# Patient Record
Sex: Female | Born: 1960 | Race: White | Hispanic: No | Marital: Married | Smoking: Never smoker
Health system: Southern US, Community
[De-identification: ages and names within clinical notes are randomized; demographics above are authoritative.]

## PROBLEM LIST (undated history)

## (undated) DIAGNOSIS — G43909 Migraine, unspecified, not intractable, without status migrainosus: Secondary | ICD-10-CM

## (undated) DIAGNOSIS — D509 Iron deficiency anemia, unspecified: Secondary | ICD-10-CM

## (undated) HISTORY — PX: OTHER SURGICAL HISTORY: SHX169

## (undated) HISTORY — DX: Migraine, unspecified, not intractable, without status migrainosus: G43.909

## (undated) HISTORY — DX: Iron deficiency anemia, unspecified: D50.9

---

## 1999-06-03 ENCOUNTER — Other Ambulatory Visit: Admission: RE | Admit: 1999-06-03 | Discharge: 1999-06-03 | Payer: Self-pay | Admitting: Obstetrics & Gynecology

## 1999-07-08 ENCOUNTER — Encounter: Payer: Self-pay | Admitting: Obstetrics & Gynecology

## 1999-07-08 ENCOUNTER — Encounter: Admission: RE | Admit: 1999-07-08 | Discharge: 1999-07-08 | Payer: Self-pay | Admitting: Obstetrics & Gynecology

## 2000-07-08 ENCOUNTER — Encounter: Payer: Self-pay | Admitting: Obstetrics & Gynecology

## 2000-07-08 ENCOUNTER — Encounter: Admission: RE | Admit: 2000-07-08 | Discharge: 2000-07-08 | Payer: Self-pay | Admitting: Obstetrics & Gynecology

## 2000-07-11 ENCOUNTER — Other Ambulatory Visit: Admission: RE | Admit: 2000-07-11 | Discharge: 2000-07-11 | Payer: Self-pay | Admitting: Obstetrics & Gynecology

## 2001-07-11 ENCOUNTER — Other Ambulatory Visit: Admission: RE | Admit: 2001-07-11 | Discharge: 2001-07-11 | Payer: Self-pay | Admitting: Obstetrics & Gynecology

## 2001-08-15 ENCOUNTER — Encounter (INDEPENDENT_AMBULATORY_CARE_PROVIDER_SITE_OTHER): Payer: Self-pay

## 2001-08-15 ENCOUNTER — Ambulatory Visit (HOSPITAL_COMMUNITY): Admission: RE | Admit: 2001-08-15 | Discharge: 2001-08-15 | Payer: Self-pay | Admitting: Obstetrics & Gynecology

## 2002-07-25 ENCOUNTER — Other Ambulatory Visit: Admission: RE | Admit: 2002-07-25 | Discharge: 2002-07-25 | Payer: Self-pay | Admitting: Obstetrics & Gynecology

## 2003-07-31 ENCOUNTER — Other Ambulatory Visit: Admission: RE | Admit: 2003-07-31 | Discharge: 2003-07-31 | Payer: Self-pay | Admitting: Obstetrics & Gynecology

## 2003-08-01 ENCOUNTER — Encounter: Admission: RE | Admit: 2003-08-01 | Discharge: 2003-08-01 | Payer: Self-pay | Admitting: Obstetrics & Gynecology

## 2004-04-26 ENCOUNTER — Encounter: Admission: RE | Admit: 2004-04-26 | Discharge: 2004-04-26 | Payer: Self-pay | Admitting: Orthopedic Surgery

## 2004-08-23 ENCOUNTER — Encounter: Admission: RE | Admit: 2004-08-23 | Discharge: 2004-08-23 | Payer: Self-pay | Admitting: Orthopedic Surgery

## 2005-08-04 ENCOUNTER — Encounter: Admission: RE | Admit: 2005-08-04 | Discharge: 2005-08-04 | Payer: Self-pay | Admitting: Obstetrics & Gynecology

## 2008-01-24 ENCOUNTER — Encounter: Admission: RE | Admit: 2008-01-24 | Discharge: 2008-01-24 | Payer: Self-pay | Admitting: Obstetrics & Gynecology

## 2008-01-25 ENCOUNTER — Encounter: Admission: RE | Admit: 2008-01-25 | Discharge: 2008-01-25 | Payer: Self-pay | Admitting: Family Medicine

## 2009-08-18 ENCOUNTER — Ambulatory Visit (HOSPITAL_COMMUNITY): Admission: RE | Admit: 2009-08-18 | Discharge: 2009-08-18 | Payer: Self-pay | Admitting: Obstetrics & Gynecology

## 2009-11-20 ENCOUNTER — Encounter (INDEPENDENT_AMBULATORY_CARE_PROVIDER_SITE_OTHER): Payer: Self-pay | Admitting: Family Medicine

## 2009-11-20 ENCOUNTER — Ambulatory Visit: Payer: Self-pay | Admitting: Vascular Surgery

## 2009-11-20 ENCOUNTER — Ambulatory Visit: Admission: RE | Admit: 2009-11-20 | Discharge: 2009-11-20 | Payer: Self-pay | Admitting: Family Medicine

## 2010-03-10 ENCOUNTER — Encounter: Admission: RE | Admit: 2010-03-10 | Discharge: 2010-03-10 | Payer: Self-pay | Admitting: Obstetrics & Gynecology

## 2010-06-14 ENCOUNTER — Encounter: Payer: Self-pay | Admitting: Obstetrics & Gynecology

## 2010-08-10 ENCOUNTER — Other Ambulatory Visit: Payer: Self-pay | Admitting: Gastroenterology

## 2010-08-16 LAB — CBC
Hemoglobin: 13.3 g/dL (ref 12.0–15.0)
MCHC: 34.1 g/dL (ref 30.0–36.0)
Platelets: 249 10*3/uL (ref 150–400)
RDW: 13.3 % (ref 11.5–15.5)

## 2010-08-16 LAB — PREGNANCY, URINE: Preg Test, Ur: NEGATIVE

## 2010-10-09 NOTE — Op Note (Signed)
[facility]  Patient:    Kara Donaldson, Kara Donaldson Visit Number: [id number] MRN: [id number]          Service Type: DSU Location: [employer] Attending Physician:  Genia Del Dictated by:   Genia Del, M.D. Proc. Date: 08/15/01 Admit Date:  08/15/2001                             Operative Report  DATE OF BIRTH:                1960-09-23  PREOPERATIVE DIAGNOSES:       Menometrorrhagia with probable endometrial polyps.  POSTOPERATIVE DIAGNOSES:      Menometrorrhagia with probable endometrial polyps, confirmed endometrial polyps.  INTERVENTION:                 Hysteroscopy with polypectomies and dilatation and curettage.  SURGEON:                      Genia Del, M.D.  ANESTHESIOLOGIST:             Gretta Cool., M.D.  INTERVENTION:                 Under general anesthesia with laryngeal mask the patient is in lithotomy position.  She is prepped with Betadine on the suprapubic, vulvar, and vaginal areas.  The bladder is emptied with a catheter and the patient is draped as usual.  The vaginal examination reveals a retroverted uterus, mobile, normal volume, no adnexal mass.  The speculum is inserted.  A paracervical block is made with 1% Nesacaine 20 cc at 4 and 8 oclock.  We then grasp the anterior lip of the cervix with a tenaculum and dilatation is achieved with Hegar dilators up to #33.  We then insert the resectoscope.  The largest polyp was just at the opening of the internal os and was removed by inserting the resectoscope.  That specimen is sent to pathology.  We then see a second polyp that is removed the same way and then reenter the resectoscope and complete visualization of the endometrial cavity. A few additional smaller polyps are present and are excised with the loop. All specimens all sent together to pathology.  We then remove the resectoscope and proceed with a systematic curettage of the endometrial cavity with  the sharp curette.  Endometrial curettings are sent separately.  We then reintroduce the hysteroscope.  The intrauterine cavity is regular.  No additional lesion is present.  Hemostasis is adequate.  Pictures are taken. The resectoscope is removed.  We then remove the tenaculum.  Hemostasis is completed at that level with the silver nitrate stick.  We then remove the speculum.  Ins and outs were about -100 cc.  The estimated blood loss was minimal.  No complication occurred and the patient was brought to recovery room in good status. Dictated by:   Genia Del, M.D. Attending Physician:  Genia Del DD:  08/15/01 TD:  08/16/01 Job: [id number]

## 2011-07-27 ENCOUNTER — Other Ambulatory Visit: Payer: Self-pay | Admitting: Obstetrics & Gynecology

## 2011-07-27 DIAGNOSIS — Z1231 Encounter for screening mammogram for malignant neoplasm of breast: Secondary | ICD-10-CM

## 2011-08-09 ENCOUNTER — Ambulatory Visit
Admission: RE | Admit: 2011-08-09 | Discharge: 2011-08-09 | Disposition: A | Discharge status: Home / Self Care | Payer: 59 | Source: Ambulatory Visit | Attending: Obstetrics & Gynecology | Admitting: Obstetrics & Gynecology

## 2011-08-09 DIAGNOSIS — Z1231 Encounter for screening mammogram for malignant neoplasm of breast: Secondary | ICD-10-CM

## 2011-08-11 ENCOUNTER — Other Ambulatory Visit: Payer: Self-pay | Admitting: Obstetrics & Gynecology

## 2011-08-11 DIAGNOSIS — R928 Other abnormal and inconclusive findings on diagnostic imaging of breast: Secondary | ICD-10-CM

## 2011-08-17 ENCOUNTER — Ambulatory Visit
Admission: RE | Admit: 2011-08-17 | Discharge: 2011-08-17 | Disposition: A | Discharge status: Home / Self Care | Payer: 59 | Source: Ambulatory Visit | Attending: Obstetrics & Gynecology | Admitting: Obstetrics & Gynecology

## 2011-08-17 DIAGNOSIS — R928 Other abnormal and inconclusive findings on diagnostic imaging of breast: Secondary | ICD-10-CM

## 2012-06-14 ENCOUNTER — Other Ambulatory Visit: Payer: Self-pay | Admitting: Family Medicine

## 2012-06-14 ENCOUNTER — Ambulatory Visit
Admission: RE | Admit: 2012-06-14 | Discharge: 2012-06-14 | Disposition: A | Discharge status: Home / Self Care | Payer: 59 | Source: Ambulatory Visit | Attending: Family Medicine | Admitting: Family Medicine

## 2012-06-14 DIAGNOSIS — M549 Dorsalgia, unspecified: Secondary | ICD-10-CM

## 2012-09-27 ENCOUNTER — Other Ambulatory Visit: Payer: Self-pay

## 2012-09-27 DIAGNOSIS — Z1231 Encounter for screening mammogram for malignant neoplasm of breast: Secondary | ICD-10-CM

## 2012-10-25 ENCOUNTER — Ambulatory Visit
Admission: RE | Admit: 2012-10-25 | Discharge: 2012-10-25 | Disposition: A | Discharge status: Home / Self Care | Payer: 59 | Source: Ambulatory Visit

## 2012-10-25 DIAGNOSIS — Z1231 Encounter for screening mammogram for malignant neoplasm of breast: Secondary | ICD-10-CM

## 2013-11-21 ENCOUNTER — Other Ambulatory Visit: Payer: Self-pay

## 2013-11-21 DIAGNOSIS — Z1231 Encounter for screening mammogram for malignant neoplasm of breast: Secondary | ICD-10-CM

## 2013-11-26 ENCOUNTER — Ambulatory Visit
Admission: RE | Admit: 2013-11-26 | Discharge: 2013-11-26 | Disposition: A | Discharge status: Home / Self Care | Payer: 59 | Source: Ambulatory Visit

## 2013-11-26 ENCOUNTER — Encounter (INDEPENDENT_AMBULATORY_CARE_PROVIDER_SITE_OTHER): Payer: Self-pay

## 2013-11-26 DIAGNOSIS — Z1231 Encounter for screening mammogram for malignant neoplasm of breast: Secondary | ICD-10-CM

## 2015-02-04 ENCOUNTER — Encounter: Payer: Self-pay | Admitting: Physical Therapy

## 2015-02-04 ENCOUNTER — Ambulatory Visit: Payer: 59 | Attending: Family Medicine | Admitting: Physical Therapy

## 2015-02-04 DIAGNOSIS — M545 Low back pain, unspecified: Secondary | ICD-10-CM

## 2015-02-04 NOTE — Therapy (Signed)
[facility] [address], [address] [place], [place], [address] Phone: [phone]   Fax:  [phone]  Physical Therapy Evaluation  Patient Details  Name: Kara Donaldson MRN: [id number] Date of Birth: 06/11/1960 Referring Provider:  Farris Has, MD  Encounter Date: 02/04/2015      PT End of Session - 02/04/15 1306    Visit Number 1   Date for PT Re-Evaluation 03/18/15   PT Start Time 1230   PT Stop Time 1320   PT Time Calculation (min) 50 min   Activity Tolerance Patient tolerated treatment well   Behavior During Therapy [facility] for tasks assessed/performed      Past Medical History  Diagnosis Date  . Migraines   . Iron deficiency anemia     Past Surgical History  Procedure Laterality Date  . Right shoulder surgery      There were no vitals filed for this visit.  Visit Diagnosis:  Bilateral low back pain without sciatica - Plan: PT plan of care cert/re-cert      Subjective Assessment - 02/04/15 1241    Subjective Patient reports chronic back pain that began 10 years ago with sudden onset.  Patient reports pain has become progressively worse.  Patiient has difficulty with pushing things due to pain.  Unable to push golf cart, vaccuum, sweep.    Limitations Sitting   How long can you sit comfortably? slightly   How long can you stand comfortably? increases pain   How long can you walk comfortably? pushing golf cart up hill   Patient Stated Goals play golf with less pain   Currently in Pain? Yes   Pain Score 8    Pain Location Back  base of tailbone   Pain Descriptors / Indicators Stabbing   Pain Type Chronic pain   Pain Onset More than a month ago   Pain Frequency Intermittent   Aggravating Factors  standing, pushing items   Pain Relieving Factors laying down   Effect of Pain on Daily Activities housework and golf   Multiple Pain Sites No            [facility] PT Assessment - 02/04/15 0001    Assessment    Medical Diagnosis M54.9 Back pain   Prior Therapy None   Precautions   Precautions None   Balance Screen   Has the patient fallen in the past 6 months No   Has the patient had a decrease in activity level because of a fear of falling?  No   Is the patient reluctant to leave their home because of a fear of falling?  No   Prior Function   Level of Independence Independent   Leisure golf   Cognition   Overall Cognitive Status Within Functional Limits for tasks assessed   Observation/Other Assessments   Focus on Therapeutic Outcomes (FOTO)  65% limitation  goal 38% limitaiton   ROM / Strength   AROM / PROM / Strength AROM;Strength   AROM   AROM Assessment Site Lumbar   Lumbar Flexion decreased by 25%   Lumbar Extension decreased by 50%   Lumbar - Right Side Bend decreased by 50%   Lumbar - Left Side Bend decreased by 50%   Lumbar - Right Rotation decreased by 25%   Lumbar - Left Rotation decreased by 25%   Palpation   Spinal mobility decreased mobility of T8-L5   SI assessment  left rotated posteriorly; sacrum rotated right   Special Tests  Special Tests Sacrolliac Tests   Sacroiliac Tests  Pelvic Distraction   Pelvic Dictraction   Findings Positive   Side  Left   Comment pain                   [facility] Adult PT Treatment/Exercise - 02/04/15 0001    Modalities   Modalities Electrical Stimulation;Moist Heat   Moist Heat Therapy   Number Minutes Moist Heat 15 Minutes   Moist Heat Location Lumbar Spine  supine   Electrical Stimulation   Electrical Stimulation Location lumbar    Electrical Stimulation Action IFC   Electrical Stimulation Parameters to patient tolerance 15 min   Electrical Stimulation Goals Pain   Manual Therapy   Manual Therapy Muscle Energy Technique;Joint mobilization   Joint Mobilization corrected sacrum rotated right   Muscle Energy Technique corrected posteriorly rotated left ilium                PT Education - 02/04/15 1306     Education provided Yes   Education Details body mechanics   Person(s) Educated Patient   Methods Explanation;Handout;Verbal cues   Comprehension Verbalized understanding          PT Short Term Goals - 02/04/15 1307    PT SHORT TERM GOAL #1   Title understand correct body mechanics with daily tasks   Time 3   Period Weeks   Status New   PT SHORT TERM GOAL #2   Title pain with daily tasks decreased >/= 25%   Time 3   Period Weeks   Status New           PT Long Term Goals - 02/04/15 1308    PT LONG TERM GOAL #1   Title independent with HEP and understand how to progress herself   Time 6   Period Weeks   Status New   PT LONG TERM GOAL #2   Title return to golf with minimal pain   Time 6   Period Weeks   Status New   PT LONG TERM GOAL #3   Title stand and push a vacuum and mop with minimal pain   Time 6   Period Weeks   Status New   PT LONG TERM GOAL #4   Title push golf bag with pain decreased >/= 50% so she can walk on golf course   Time 6   Period Weeks   Status New               Plan - 02/04/15 1403    Clinical Impression Statement Patient is a 54 year old female with diagnosis of back pain that started 2 years ago.  Patient has had a recent flare-up last week after playing golf.  Patient is an avid Designer, television/film set.  Patient reports her intermitent back pain at level 8/10.  Patient reports her pain is worse with standing and pushing items.  FOTO score is  65% kimitation.  Lumbar ROM deficits as follows: flexion decreased by 25%, extesnion decreased by 50%, bil. sidebending decreased by 50%,  and bil. rotation decreased by 25%.  Decreased mobility of T8-L5.  Left ilium is rotated posteriorly and sacrum rotated right.  Positive ditraction test on left with pain.  Patient would benefit from physical therapy to decrease back pain and improve mobility to return to golf.    Pt will benefit from skilled therapeutic intervention in order to improve on the following  deficits Decreased activity tolerance;Decreased mobility;Decreased strength;Impaired flexibility;Pain;Increased muscle spasms;Decreased  endurance;Decreased range of motion   Rehab Potential Excellent   Clinical Impairments Affecting Rehab Potential None   PT Frequency 2x / week   PT Duration 6 weeks   PT Treatment/Interventions Cryotherapy;Electrical Stimulation;Moist Heat;Therapeutic exercise;Therapeutic activities;Ultrasound;Neuromuscular re-education;Patient/family education;Manual techniques   PT Next Visit Plan correct pelvis, joint mobilization to T8-L5, soft tissue work to lumbar, modalities as needed, flexibility exercises, lumbar stabilization exercises   PT Home Exercise Plan flexibility exercises   Recommended Other Services None   Consulted and Agree with Plan of Care Patient         Problem List There are no active problems to display for this patient.   GRAY,CHERYL,PT 02/04/2015, 2:11 PM  [facility] [address], [address] [place], [place], [address] Phone: [phone]   Fax:  [phone]

## 2015-02-04 NOTE — Patient Instructions (Signed)
Sleeping on Side   Place pillow between knees. Use cervical support under neck and a roll around waist as needed.   Copyright  VHI. All rights reserved.  Posture - Sitting   Sit upright, head facing forward. Try using a roll to support lower back. Keep shoulders relaxed, and avoid rounded back. Keep hips level with knees. Avoid crossing legs for long periods.   Copyright  VHI. All rights reserved.  Alternating Positions   Alternate tasks and change positions frequently to reduce fatigue and muscle tension. Take rest breaks.   Copyright  VHI. All rights reserved.  Getting Into / Out of Car   Lower self onto seat, scoot back, then bring in one leg at a time. Reverse sequence to get out.   Copyright  VHI. All rights reserved.  Bending   Bend at hips and knees, not back. Keep feet shoulder-width apart.   Copyright  VHI. All rights reserved.  Avoid Twisting   Avoid twisting or bending back. Pivot around using foot movements, and bend at knees if needed when reaching for articles.   Copyright  VHI. All rights reserved.   [facility] [address], [address] [place], [place] [address] Phone # [phone] Fax [phone]

## 2015-02-06 ENCOUNTER — Other Ambulatory Visit: Payer: Self-pay | Admitting: Family Medicine

## 2015-02-06 DIAGNOSIS — M545 Low back pain: Secondary | ICD-10-CM

## 2015-02-10 ENCOUNTER — Encounter: Payer: Self-pay | Admitting: Physical Therapy

## 2015-02-10 ENCOUNTER — Ambulatory Visit: Payer: 59 | Admitting: Physical Therapy

## 2015-02-10 DIAGNOSIS — M545 Low back pain, unspecified: Secondary | ICD-10-CM

## 2015-02-10 NOTE — Therapy (Signed)
[facility] [address], [address] [place], [place], [address] Phone: [phone]   Fax:  [phone]  Physical Therapy Treatment  Patient Details  Name: Kara Donaldson MRN: [id number] Date of Birth: 08-Mar-1961 Referring Provider:  Farris Has, MD  Encounter Date: 02/10/2015      PT End of Session - 02/10/15 1059    Visit Number 2   Date for PT Re-Evaluation 03/18/15   PT Start Time 1015   PT Stop Time 1115   PT Time Calculation (min) 60 min   Activity Tolerance Patient tolerated treatment well   Behavior During Therapy [facility] for tasks assessed/performed      Past Medical History  Diagnosis Date  . Migraines   . Iron deficiency anemia     Past Surgical History  Procedure Laterality Date  . Right shoulder surgery      There were no vitals filed for this visit.  Visit Diagnosis:  Bilateral low back pain without sciatica      Subjective Assessment - 02/10/15 1019    Subjective yesterday was first day I could sit up for some time and not lay flat. Feels better since eval.    Currently in Pain? Yes   Pain Score 2    Pain Location Sacrum   Aggravating Factors  Standing/sitting   Pain Relieving Factors Laying flat   Multiple Pain Sites No                         [facility] Adult PT Treatment/Exercise - 02/10/15 0001    Lumbar Exercises: Stretches   Active Hamstring Stretch 3 reps;20 seconds  with strap   Single Knee to Chest Stretch 3 reps;20 seconds   Hip Flexor Stretch 3 reps;20 seconds   Electrical Stimulation   Electrical Stimulation Location lumbar    Electrical Stimulation Action IFC   Electrical Stimulation Goals Pain   Ultrasound   Ultrasound Location Left lumbo sacral   Ultrasound Parameters 100% 1 MX 1.2 wtcm2   Ultrasound Goals Pain                PT Education - 02/10/15 1034    Education provided Yes   Education Details HEP stretches; single knee to chest, hamstring,  quad/hipflexor   Person(s) Educated Patient   Methods Explanation;Demonstration;Tactile cues;Verbal cues;Handout   Comprehension Verbalized understanding;Returned demonstration          PT Short Term Goals - 02/10/15 1022    PT SHORT TERM GOAL #2   Title pain with daily tasks decreased >/= 25%   Period Weeks   Status On-going  Just started PT           PT Long Term Goals - 02/04/15 1308    PT LONG TERM GOAL #1   Title independent with HEP and understand how to progress herself   Time 6   Period Weeks   Status New   PT LONG TERM GOAL #2   Title return to golf with minimal pain   Time 6   Period Weeks   Status New   PT LONG TERM GOAL #3   Title stand and push a vacuum and mop with minimal pain   Time 6   Period Weeks   Status New   PT LONG TERM GOAL #4   Title push golf bag with pain decreased >/= 50% so she can walk on golf course   Time 6   Period Weeks  Status New               Plan - 02/10/15 1059    Clinical Impression Statement Pt's LT pelvis in alignment today. Pain coming down where pt could actually sit comfortably yesterday. Added stretches to HEP today and used modalities for pain.    Pt will benefit from skilled therapeutic intervention in order to improve on the following deficits Decreased activity tolerance;Decreased mobility;Decreased strength;Impaired flexibility;Pain;Increased muscle spasms;Decreased endurance;Decreased range of motion   Rehab Potential Excellent   Clinical Impairments Affecting Rehab Potential None   PT Frequency 2x / week   PT Duration 6 weeks   PT Treatment/Interventions Cryotherapy;Electrical Stimulation;Moist Heat;Therapeutic exercise;Therapeutic activities;Ultrasound;Neuromuscular re-education;Patient/family education;Manual techniques   PT Next Visit Plan review stretches, modalities for pain, add core stabs to HEP in next 1-2 visits.   Consulted and Agree with Plan of Care Patient        Problem List There  are no active problems to display for this patient.   COCHRAN,JENNIFER, PTA 02/10/2015, 11:47 AM  [facility] [address], [address] [place], [place], [address] Phone: [phone]   Fax:  [phone]

## 2015-02-10 NOTE — Patient Instructions (Signed)
Bridge   Lie back, legs bent. Inhale, pressing hips up. Keeping ribs in, lengthen lower back. Exhale, rolling down along spine from top. Repeat ____ times. Do ____ sessions per day.  Copyright  VHI. All rights reserved.   Pelvic Tilt   Flatten back by tightening stomach muscles and buttocks. Repeat ____ times per set. Do ____ sets per session. Do ____ sessions per day.  http://orth.exer.us/134   Copyright  VHI. All rights reserved. Knee to Chest (Flexion)   Pull knee toward chest. Feel stretch in lower back or buttock area. Breathing deeply, Hold ____ seconds. Repeat with other knee. Repeat ____ times. Do ____ sessions per day.  http://gt2.exer.us/225      Copyright  VHI. All rights reserved.  Supine: Leg Stretch With Strap (Basic)   Lie on back with one knee bent, foot flat on floor. Hook strap around other foot. Straighten knee. Keep knee level with other knee. Hold ___ seconds. Relax leg completely down to floor.  Repeat ___ times per session. Do ___ sessions per day.  Copyright  VHI. All rights reserved.

## 2015-02-12 ENCOUNTER — Ambulatory Visit: Payer: 59

## 2015-02-12 DIAGNOSIS — M545 Low back pain, unspecified: Secondary | ICD-10-CM

## 2015-02-12 NOTE — Therapy (Signed)
[facility] [address], [address] [place], [place], [address] Phone: [phone]   Fax:  [phone]  Physical Therapy Treatment  Patient Details  Name: Kara Donaldson MRN: [id number] Date of Birth: 1961-01-18 Referring Provider:  Farris Has, MD  Encounter Date: 02/12/2015      PT End of Session - 02/12/15 1009    Visit Number 3   Date for PT Re-Evaluation 03/18/15   PT Start Time 0930   PT Stop Time 1025   PT Time Calculation (min) 55 min   Activity Tolerance Patient tolerated treatment well   Behavior During Therapy [facility] for tasks assessed/performed      Past Medical History  Diagnosis Date  . Migraines   . Iron deficiency anemia     Past Surgical History  Procedure Laterality Date  . Right shoulder surgery      There were no vitals filed for this visit.  Visit Diagnosis:  Bilateral low back pain without sciatica      Subjective Assessment - 02/12/15 0934    Subjective Exercises are going well.     Currently in Pain? Yes   Pain Score 3    Pain Location Sacrum   Pain Descriptors / Indicators Burning;Aching   Pain Type Chronic pain   Pain Onset More than a month ago   Pain Frequency Intermittent   Aggravating Factors  standing too long, sitting too long, housework   Pain Relieving Factors stretching, Advil                         [facility] Adult PT Treatment/Exercise - 02/12/15 0001    Exercises   Exercises Knee/Hip   Lumbar Exercises: Stretches   Active Hamstring Stretch 3 reps;20 seconds  seated   Single Knee to Chest Stretch 3 reps;20 seconds   Hip Flexor Stretch 3 reps;20 seconds   Pelvic Tilt Other (comment)  5 second hold, 20 reps   Knee/Hip Exercises: Aerobic   Nustep Level 2x 8 minutes  seat 5, arms 8   Moist Heat Therapy   Number Minutes Moist Heat 15 Minutes   Moist Heat Location Lumbar Spine   Electrical Stimulation   Electrical Stimulation Location lumbar    Electrical Stimulation Action IFC   Electrical Stimulation Parameters 15 minutes    Electrical Stimulation Goals Pain                PT Education - 02/12/15 0941    Education provided Yes   Education Details body mechanics, seated hamstring and piriformis stretch   Person(s) Educated Patient   Methods Explanation;Demonstration;Handout   Comprehension Verbalized understanding;Returned demonstration          PT Short Term Goals - 02/12/15 0936    PT SHORT TERM GOAL #1   Title understand correct body mechanics with daily tasks   Time 3   Period Weeks   Status Achieved  education provided today   PT SHORT TERM GOAL #2   Title pain with daily tasks decreased >/= 25%   Time 3   Period Weeks   Status Achieved           PT Long Term Goals - 02/04/15 1308    PT LONG TERM GOAL #1   Title independent with HEP and understand how to progress herself   Time 6   Period Weeks   Status New   PT LONG TERM GOAL #2   Title return to golf with  minimal pain   Time 6   Period Weeks   Status New   PT LONG TERM GOAL #3   Title stand and push a vacuum and mop with minimal pain   Time 6   Period Weeks   Status New   PT LONG TERM GOAL #4   Title push golf bag with pain decreased >/= 50% so she can walk on golf course   Time 6   Period Weeks   Status New               Plan - 02/12/15 8119    Clinical Impression Statement Pt reports 50% overall improvement in symptoms since the start of care.  Pt has received body mechanics education regarding lifting and housework principles.  Pt with conitnued pain that limits housework and sitting/standing long periods. Pt will benefit from skilled PT for flexibility, core strength and modalities PRN   Pt will benefit from skilled therapeutic intervention in order to improve on the following deficits Decreased activity tolerance;Decreased mobility;Decreased strength;Impaired flexibility;Pain;Increased muscle spasms;Decreased  endurance;Decreased range of motion   Rehab Potential Excellent   PT Frequency 2x / week   PT Duration 6 weeks   PT Treatment/Interventions Cryotherapy;Electrical Stimulation;Moist Heat;Therapeutic exercise;Therapeutic activities;Ultrasound;Neuromuscular re-education;Patient/family education;Manual techniques   PT Next Visit Plan Core strength progression, modalities as needed.     Consulted and Agree with Plan of Care Patient        Problem List There are no active problems to display for this patient.   TAKACS,KELLY, PT 02/12/2015, 10:10 AM  [facility] [address], [address] [place], [place], [address] Phone: [phone]   Fax:  [phone]

## 2015-02-12 NOTE — Patient Instructions (Addendum)
   Lifting Principles  .Maintain proper posture and head alignment. .Slide object as close as possible before lifting. .Move obstacles out of the way. .Test before lifting; ask for help if too heavy. .Tighten stomach muscles without holding breath. .Use smooth movements; do not jerk. .Use legs to do the work, and pivot with feet. .Distribute the work load symmetrically and close to the center of trunk. .Push instead of pull whenever possible.   Squat down and hold basket close to stand. Use leg muscles to do the work.    Avoid twisting or bending back. Pivot around using foot movements, and bend at knees if needed when reaching for articles.        Getting Into / Out of Bed   Lower self to lie down on one side by raising legs and lowering head at the same time. Use arms to assist moving without twisting. Bend both knees to roll onto back if desired. To sit up, start from lying on side, and use same move-ments in reverse. Keep trunk aligned with legs.    Shift weight from front foot to back foot as item is lifted off shelf.    When leaning forward to pick object up from floor, extend one leg out behind. Keep back straight. Hold onto a sturdy support with other hand.      Sit upright, head facing forward. Try using a roll to support lower back. Keep shoulders relaxed, and avoid rounded back. Keep hips level with knees. Avoid crossing legs for long periods.  HIP: Hamstrings - Short Sitting   Rest leg on raised surface. Keep knee straight. Lift chest. Hold __20_ seconds. 3_ reps per set, _3__ sets per day  Copyright  VHI. All rights reserved.  Piriformis Stretch, Sitting   Sit, one ankle on opposite knee, same-side hand on crossed knee. Push down on knee, keeping spine straight. Lean torso forward, with flat back, until tension is felt in hamstrings and gluteals of crossed-leg side. Hold _20_ seconds.  Repeat _3__ times per session. Do 3___ sessions per  day.  Copyright  VHI. All rights reserved.    [facility] [address] [place], [place] [address] Phone # [phone] Fax [phone]

## 2015-02-16 ENCOUNTER — Ambulatory Visit
Admission: RE | Admit: 2015-02-16 | Discharge: 2015-02-16 | Disposition: A | Discharge status: Home / Self Care | Payer: 59 | Source: Ambulatory Visit | Attending: Family Medicine | Admitting: Family Medicine

## 2015-02-16 DIAGNOSIS — M545 Low back pain: Secondary | ICD-10-CM

## 2015-02-17 ENCOUNTER — Ambulatory Visit: Payer: 59 | Admitting: Physical Therapy

## 2015-02-17 ENCOUNTER — Encounter: Payer: Self-pay | Admitting: Physical Therapy

## 2015-02-17 DIAGNOSIS — M545 Low back pain, unspecified: Secondary | ICD-10-CM

## 2015-02-17 NOTE — Patient Instructions (Addendum)
Lower abdominal/core stability exercises  1. Practice your breathing technique: Inhale through your nose expanding your belly and rib cage. Try not to breathe into your chest. Exhale slowly and gradually out your mouth feeling a sense of softness to your body. Practice multiple times. This can be performed unlimited.  2. Finding the lower abdominals. Laying on your back with the knees bent, place your fingers just below your belly button. Using your breathing technique from above, on your exhale gently pull the belly button away from your fingertips without tensing any other muscles. Practice this 5x. Next, as you exhale, draw belly button inwards and hold onto it...then feel as if you are pulling that muscle across your pelvis like you are tightening a belt. This can be hard to do at first so be patient and practice. Do 5-10 reps 1-3 x day. Always recognize quality over quantity; if your abdominal muscles become tired you will notice you may tighten/contract other muscles. This is the time to take a break.   Practice this first laying on your back, then in sitting, progressing to standing and finally adding it to all your daily movements.   3. Finding your pelvic floor. Using the breathing technique above, when your exhale, this time draw your pelvic floor muscles up as if you were attempting to stop the flow of urination. Be careful NOT to tense any other muscles. This can be hard, BE PATIENT. Try to hold up to 10 seconds repeating 10x. Try 2x a day. Once you feel you are doing this well, add this contraction to exercise #2. First contracting your pelvic floor followed by lower abdominals.   4. Adding leg movements. Add the following leg movements to challenge your ability to keep your core stable:  1. Single leg drop outs: Laying on your back with knees bent feet flat. Inhale,  dropping one knee outward KEEPING YOUR PELVIS STILL. Exhale as you bring the leg back, simultaneously performing your lower  abdominal contraction. Do 5-10 on each leg.   2. Marching: While keeping your pelvis still, lift the right foot a few inches, put it down then lift left foot. This will mimic a march. Start slow to establish control. Once you have control you may speed it up. Do 10-20x. You MUST keep your lower abdominlas contracted while you march. Breathe naturally      [facility] [address], [address] [place], [place] [address] Phone # [phone] Fax [phone] 

## 2015-02-17 NOTE — Therapy (Signed)
[facility] [address] [place], [place], [address] Phone: [phone]   Fax:  [phone]  Physical Therapy Treatment  Patient Details  Name: Kara Donaldson MRN: [id number] Date of Birth: 24-Jun-1960 Referring Provider:  London Pepper, MD  Encounter Date: 02/17/2015      PT End of Session - 02/17/15 1406    Visit Number 4   Date for PT Re-Evaluation 03/18/15   PT Start Time 1400   PT Stop Time 1500   PT Time Calculation (min) 60 min   Activity Tolerance Patient tolerated treatment well   Behavior During Therapy [facility] for tasks assessed/performed      Past Medical History  Diagnosis Date  . Migraines   . Iron deficiency anemia     Past Surgical History  Procedure Laterality Date  . Right shoulder surgery      There were no vitals filed for this visit.  Visit Diagnosis:  Bilateral low back pain without sciatica      Subjective Assessment - 02/17/15 1406    Subjective Yesterday was a bad day.  I had trouble getting comfortable. I had a MRI done yesterday.    Limitations Sitting   How long can you sit comfortably? slightly   How long can you stand comfortably? increases pain   How long can you walk comfortably? pushing golf cart up hill   Patient Stated Goals play golf with less pain   Currently in Pain? Yes   Pain Score 7   this afternoon 3/10   Pain Location Sacrum   Pain Orientation Left;Right   Pain Descriptors / Indicators Burning;Aching   Pain Type Chronic pain   Pain Onset More than a month ago   Pain Frequency Constant   Aggravating Factors  standing too long, sitting too long, housework   Pain Relieving Factors stretching, Advil   Effect of Pain on Daily Activities housework and gold   Multiple Pain Sites No            [facility] PT Assessment - 02/17/15 0001    Palpation   SI assessment  left ilium posteriorly rotated                     [facility] Adult PT  Treatment/Exercise - 02/17/15 0001    Lumbar Exercises: Stretches   Active Hamstring Stretch 3 reps;20 seconds  seated   Passive Hamstring Stretch 2 reps  bil.    Single Knee to Chest Stretch 3 reps;20 seconds   Pelvic Tilt Other (comment)  5 second hold, 20 reps   Piriformis Stretch 2 reps  bil. by therapist   Lumbar Exercises: Supine   Clam 10 reps  each side with tactile cues for abdominal bracing   Bent Knee Raise 10 reps  each side with abdominal bracing   Moist Heat Therapy   Number Minutes Moist Heat 15 Minutes   Moist Heat Location Lumbar Spine  hookly   Electrical Stimulation   Electrical Stimulation Location lumbar    Electrical Stimulation Action IFC   Electrical Stimulation Parameters 15 min, to patient tolerance   Electrical Stimulation Goals Pain   Manual Therapy   Manual Therapy Muscle Energy Technique;Soft tissue mobilization   Soft tissue mobilization left piriformis  sidely   Muscle Energy Technique corrected left rotated illuim                PT Education - 02/17/15 1445    Education provided Yes  Education Details TA contraction with hip rotation, hip flexion   Person(s) Educated Patient   Methods Explanation;Demonstration;Verbal cues;Handout   Comprehension Returned demonstration;Verbalized understanding          PT Short Term Goals - 02/12/15 0936    PT SHORT TERM GOAL #1   Title understand correct body mechanics with daily tasks   Time 3   Period Weeks   Status Achieved  education provided today   PT SHORT TERM GOAL #2   Title pain with daily tasks decreased >/= 25%   Time 3   Period Weeks   Status Achieved           PT Long Term Goals - 02/17/15 1412    PT LONG TERM GOAL #1   Title independent with HEP and understand how to progress herself   Time 6   Period Weeks   Status On-going  still learning exercises   PT LONG TERM GOAL #2   Title return to golf with minimal pain   Time 6   Period Weeks   Status On-going   has not golfed in a month   PT LONG TERM GOAL #3   Title stand and push a vacuum and mop with minimal pain   Time 6   Period Weeks   Status On-going  has not done yet   PT LONG TERM GOAL #4   Title push golf bag with pain decreased >/= 50% so she can walk on golf course   Time 6   Period Weeks   Status On-going  hs not golfed yet               Plan - 02/17/15 1446    Clinical Impression Statement Patient is a 54 year old female with diagnosis of back pain.  Patient had a MRI and waiting for results.  Patient reports she had increased pain yesterday and this morning.  Patient left ilium was rotated and therapist was able to correct pelvis.  Patient may benefit from an SI belt  to keep her pelvic in correct alignment.  Patient has purchased a home TENS unit she is using for pain management.  Patient has not met any LTG's due to increased pain yesterday and today.  Patient would benefit from  physical therapy to increase core strength and decrease pain.    Pt will benefit from skilled therapeutic intervention in order to improve on the following deficits Decreased activity tolerance;Decreased mobility;Decreased strength;Impaired flexibility;Pain;Increased muscle spasms;Decreased endurance;Decreased range of motion   Rehab Potential Excellent   Clinical Impairments Affecting Rehab Potential None   PT Frequency 2x / week   PT Duration 6 weeks   PT Treatment/Interventions Cryotherapy;Electrical Stimulation;Moist Heat;Therapeutic exercise;Therapeutic activities;Ultrasound;Neuromuscular re-education;Patient/family education;Manual techniques   PT Next Visit Plan Core strength progression, modalities as needed.  Review use of home TENS unit, See if pelvic needs to be corrected.    PT Home Exercise Plan progress as needed   Consulted and Agree with Plan of Care Patient        Problem List There are no active problems to display for this patient.   Daimian Sudberry,PT 02/17/2015, 2:53  PM   [facility] [address], [place], [place], [address] Phone: [phone]   Fax:  [phone]

## 2015-02-21 ENCOUNTER — Encounter: Payer: Self-pay | Admitting: Physical Therapy

## 2015-02-21 ENCOUNTER — Ambulatory Visit: Payer: 59 | Admitting: Physical Therapy

## 2015-02-21 DIAGNOSIS — M545 Low back pain, unspecified: Secondary | ICD-10-CM

## 2015-02-21 NOTE — Therapy (Signed)
[facility] [address], [address] [place], [place], [address] Phone: [phone]   Fax:  [phone]  Physical Therapy Treatment  Patient Details  Name: SARAIA PLATNER MRN: [id number] Date of Birth: 08/06/1960 Referring Provider:  Farris Has, MD  Encounter Date: 02/21/2015      PT End of Session - 02/21/15 0935    Visit Number 5   Date for PT Re-Evaluation 03/18/15   PT Start Time 0930   PT Stop Time 1010   PT Time Calculation (min) 40 min   Activity Tolerance Patient tolerated treatment well   Behavior During Therapy [facility] for tasks assessed/performed      Past Medical History  Diagnosis Date  . Migraines   . Iron deficiency anemia     Past Surgical History  Procedure Laterality Date  . Right shoulder surgery      There were no vitals filed for this visit.  Visit Diagnosis:  Bilateral low back pain without sciatica      Subjective Assessment - 02/21/15 0936    Subjective I am doing well. I have had no pain. I get a little tight.    Limitations Sitting   How long can you sit comfortably? slightly   How long can you stand comfortably? increases pain   How long can you walk comfortably? pushing golf cart up hill   Patient Stated Goals play golf with less pain   Currently in Pain? No/denies                         [facility] Adult PT Treatment/Exercise - 02/21/15 0001    Lumbar Exercises: Stretches   Active Hamstring Stretch 3 reps;20 seconds  seated   Single Knee to Chest Stretch 3 reps;20 seconds   Lower Trunk Rotation 2 reps  15 seconds with pulling on low back   Manual Therapy   Manual Therapy Soft tissue mobilization;Muscle Energy Technique   Soft tissue mobilization left quadratus, left psoas, left SI joint, left piriformis   Muscle Energy Technique corrected left rotated illuim                PT Education - 02/21/15 1010    Education provided No          PT Short  Term Goals - 02/12/15 0936    PT SHORT TERM GOAL #1   Title understand correct body mechanics with daily tasks   Time 3   Period Weeks   Status Achieved  education provided today   PT SHORT TERM GOAL #2   Title pain with daily tasks decreased >/= 25%   Time 3   Period Weeks   Status Achieved           PT Long Term Goals - 02/17/15 1412    PT LONG TERM GOAL #1   Title independent with HEP and understand how to progress herself   Time 6   Period Weeks   Status On-going  still learning exercises   PT LONG TERM GOAL #2   Title return to golf with minimal pain   Time 6   Period Weeks   Status On-going  has not golfed in a month   PT LONG TERM GOAL #3   Title stand and push a vacuum and mop with minimal pain   Time 6   Period Weeks   Status On-going  has not done yet   PT LONG TERM GOAL #4  Title push golf bag with pain decreased >/= 50% so she can walk on golf course   Time 6   Period Weeks   Status On-going  hs not golfed yet               Plan - 02/21/15 1010    Clinical Impression Statement Patient is a 54 year old female with diagnosis of back pain.  Patient found out her MRI results were negative.  Patient reports no pain just soreness.  Patient has trigger points in left quadratus, left psoas, and left SI joint.  After threrapy no pian and pelvis in correct alignment.  Patient wil lbe trying golf this weekend.  Patient will bnenfit from physical therapy to increase core strength and decrease pain.    Pt will benefit from skilled therapeutic intervention in order to improve on the following deficits Decreased activity tolerance;Decreased mobility;Decreased strength;Impaired flexibility;Pain;Increased muscle spasms;Decreased endurance;Decreased range of motion   Rehab Potential Excellent   Clinical Impairments Affecting Rehab Potential None   PT Frequency 2x / week   PT Next Visit Plan soft tissue work, quadruped exercises, check pelvic alignment   PT Home  Exercise Plan quadruped exercises   Consulted and Agree with Plan of Care Patient        Problem List There are no active problems to display for this patient.   Garek Schuneman,PT 02/21/2015, 10:13 AM  [facility] [address] [place], [place], [address] Phone: [phone]   Fax:  [phone]

## 2015-02-24 ENCOUNTER — Encounter: Payer: Self-pay | Admitting: Physical Therapy

## 2015-02-24 ENCOUNTER — Ambulatory Visit: Payer: 59 | Attending: Family Medicine | Admitting: Physical Therapy

## 2015-02-24 DIAGNOSIS — M545 Low back pain, unspecified: Secondary | ICD-10-CM

## 2015-02-24 NOTE — Therapy (Signed)
[facility] [address] [place], [place], [address] Phone: [phone]   Fax:  [phone]  Physical Therapy Treatment  Patient Details  Name: MERCEDES FORT MRN: [id number] Date of Birth: 1961-02-14 Referring Provider:  Farris Has, MD  Encounter Date: 02/24/2015      PT End of Session - 02/24/15 1101    Visit Number 6   Date for PT Re-Evaluation 03/18/15   PT Start Time 1100   PT Stop Time 1140   PT Time Calculation (min) 40 min   Activity Tolerance Patient tolerated treatment well   Behavior During Therapy [facility] for tasks assessed/performed      Past Medical History  Diagnosis Date  . Migraines   . Iron deficiency anemia     Past Surgical History  Procedure Laterality Date  . Right shoulder surgery      There were no vitals filed for this visit.  Visit Diagnosis:  Bilateral low back pain without sciatica      Subjective Assessment - 02/24/15 1104    Subjective I was able to golf 6 holes.     Limitations Sitting   How long can you sit comfortably? slightly   How long can you stand comfortably? increases pain   How long can you walk comfortably? pushing golf cart up hill   Patient Stated Goals play golf with less pain   Currently in Pain? No/denies   Multiple Pain Sites No            [facility] PT Assessment - 02/24/15 0001    Palpation   SI assessment  pelvis in correct alignment                     [facility] Adult PT Treatment/Exercise - 02/24/15 0001    Lumbar Exercises: Stretches   Active Hamstring Stretch 3 reps;20 seconds  seated   Single Knee to Chest Stretch 3 reps;20 seconds   Lower Trunk Rotation 2 reps  15 seconds with pulling on low back   Quadruped Mid Back Stretch 20 seconds;2 reps  prayer stretch   Lumbar Exercises: Supine   Bridge 15 reps;5 seconds   Lumbar Exercises: Quadruped   Single Arm Raise 10 reps;1 second;Other (comment);Right;Left  tactile cues to  contract abdomen   Straight Leg Raise 1 second;10 reps  right/left, tactile cues for abdominal bracing.    Knee/Hip Exercises: Standing   Other Standing Knee Exercises stand and push red band forward to facilitate pushing a vacuum                PT Education - 02/24/15 1131    Education provided Yes   Education Details quadruped lift one extremity, bridge, prayer stretch, cat camel   Person(s) Educated Patient   Methods Explanation;Demonstration;Verbal cues;Handout   Comprehension Returned demonstration;Verbalized understanding          PT Short Term Goals - 02/12/15 0936    PT SHORT TERM GOAL #1   Title understand correct body mechanics with daily tasks   Time 3   Period Weeks   Status Achieved  education provided today   PT SHORT TERM GOAL #2   Title pain with daily tasks decreased >/= 25%   Time 3   Period Weeks   Status Achieved           PT Long Term Goals - 02/24/15 1102    PT LONG TERM GOAL #1   Title independent with HEP and understand how  to progress herself   Time 6   Period Weeks   Status On-going   PT LONG TERM GOAL #2   Title return to golf with minimal pain   Time 6   Period Weeks   Status On-going  tried 6 holes   PT LONG TERM GOAL #3   Title stand and push a vacuum and mop with minimal pain   Time 6   Period Weeks   Status On-going   PT LONG TERM GOAL #4   Title push golf bag with pain decreased >/= 50% so she can walk on golf course   Time 6   Period Weeks   Status On-going               Plan - 02/24/15 1138    Clinical Impression Statement Patient is a 54 year old femalw with diagnosis of back pain.  Patient was able to play 6 holes of golf without increase in pain.  Patient pelvis in correct alignment.  Patient had no pain today.  Patient learned back stabilization exercises. Patient will benefit from pphysical therapy to improve core stabilization.    Pt will benefit from skilled therapeutic intervention in order to  improve on the following deficits Decreased activity tolerance;Decreased mobility;Decreased strength;Impaired flexibility;Pain;Increased muscle spasms;Decreased endurance;Decreased range of motion   Rehab Potential Excellent   Clinical Impairments Affecting Rehab Potential None   PT Frequency 2x / week   PT Duration 6 weeks   PT Treatment/Interventions Cryotherapy;Electrical Stimulation;Moist Heat;Therapeutic exercise;Therapeutic activities;Ultrasound;Neuromuscular re-education;Patient/family education;Manual techniques   PT Next Visit Plan soft tissue work, quadruped exercises, check pelvic alignment   PT Home Exercise Plan review HEP   Consulted and Agree with Plan of Care Patient        Problem List There are no active problems to display for this patient.   GRAY,CHERYL,PT 02/24/2015, 11:41 AM  [facility] [address], [address] [place], [place], [address] Phone: [phone]   Fax:  [phone]

## 2015-02-24 NOTE — Patient Instructions (Signed)
Upper Body Extension (All-Fours)    Raise right arm in front. Do not arch neck. Be sure to keep back flat. Repeat _10___ times per set. Do _1___ sets per session. Do __1__ sessions per day.  http://orth.exer.us/108   Copyright  VHI. All rights reserved.  Hip Extension (All-Fours)    Lift right leg back with knee slightly flexed. Do not arch neck or back. Repeat __10__ times per set. Do __1__ sets per session. Do _1___ sessions per day.  http://orth.exer.us/106   Copyright  VHI. All rights reserved. Cat / Cow Flow    Inhale, press spine toward ceiling like a Halloween cat. Keeping strength in arms and abdominals, exhale to soften spine through neutral and into cow pose. Open chest and arch back. Initiate movement between cat and cow at tailbone, one vertebrae at a time. Repeat __10__ times.  Copyright  VHI. All rights reserved.  Bridge    Lie back, legs bent. Inhale, pressing hips up. Keeping ribs in, lengthen lower back. Exhale, rolling down along spine from top. Repeat _10___ times. Do __1__ sessions per day.  http://pm.exer.us/55   Copyright  VHI. All rights reserved.  [facility] [address] [place], [place] [address] Phone # [phone] Fax [phone]

## 2015-02-26 ENCOUNTER — Ambulatory Visit: Payer: 59 | Admitting: Physical Therapy

## 2015-02-26 ENCOUNTER — Encounter: Payer: Self-pay | Admitting: Physical Therapy

## 2015-02-26 DIAGNOSIS — M545 Low back pain, unspecified: Secondary | ICD-10-CM

## 2015-02-26 NOTE — Therapy (Signed)
[facility] [address] [place], [place], [address] Phone: [phone]   Fax:  [phone]  Physical Therapy Treatment  Patient Details  Name: Kara Donaldson MRN: [id number] Date of Birth: [birth year] Referring Provider:  Farris Has, MD  Encounter Date: 02/26/2015      PT End of Session - 02/26/15 0957    Visit Number 7   Date for PT Re-Evaluation 03/18/15   PT Start Time 0931   PT Stop Time 1010   PT Time Calculation (min) 39 min   Activity Tolerance Patient tolerated treatment well   Behavior During Therapy [facility] for tasks assessed/performed      Past Medical History  Diagnosis Date  . Migraines   . Iron deficiency anemia     Past Surgical History  Procedure Laterality Date  . Right shoulder surgery      There were no vitals filed for this visit.  Visit Diagnosis:  Bilateral low back pain without sciatica      Subjective Assessment - 02/26/15 0935    Subjective Doing ok this AM. I "feel it" but it is low levell. I definitely need to do my stretches.   Currently in Pain? Yes   Pain Score 1    Pain Location Back   Pain Orientation Right;Left   Pain Descriptors / Indicators Dull   Aggravating Factors  Standing too long   Pain Relieving Factors stretching   Multiple Pain Sites No                         [facility] Adult PT Treatment/Exercise - 02/26/15 0001    Lumbar Exercises: Stretches   Active Hamstring Stretch 3 reps;20 seconds  seated   Single Knee to Chest Stretch 3 reps;20 seconds   Lower Trunk Rotation 2 reps  15 seconds with pulling on low back   Quadruped Mid Back Stretch 2 reps;20 seconds   Lumbar Exercises: Supine   Bridge 20 reps;3 seconds   Lumbar Exercises: Quadruped   Madcat/Old Horse 5 reps   Straight Leg Raise --  3 reps bil 3 sec hold: VC/TC for alignment   Opposite Arm/Leg Raise Right arm/Left leg;Left arm/Right leg;3 seconds   Opposite Arm/Leg Raise  Limitations --  3x bil   Knee/Hip Exercises: Stretches   Other Knee/Hip Stretches Lateral hip stretch bil 1x 20   Knee/Hip Exercises: Sidelying   Other Sidelying Knee/Hip Exercises Pilates sidelying series #1 #2 10 x bil                  PT Short Term Goals - 02/12/15 0936    PT SHORT TERM GOAL #1   Title understand correct body mechanics with daily tasks   Time 3   Period Weeks   Status Achieved  education provided today   PT SHORT TERM GOAL #2   Title pain with daily tasks decreased >/= 25%   Time 3   Period Weeks   Status Achieved           PT Long Term Goals - 02/24/15 1102    PT LONG TERM GOAL #1   Title independent with HEP and understand how to progress herself   Time 6   Period Weeks   Status On-going   PT LONG TERM GOAL #2   Title return to golf with minimal pain   Time 6   Period Weeks   Status On-going  tried 6 holes   PT  LONG TERM GOAL #3   Title stand and push a vacuum and mop with minimal pain   Time 6   Period Weeks   Status On-going   PT LONG TERM GOAL #4   Title push golf bag with pain decreased >/= 50% so she can walk on golf course   Time 6   Period Weeks   Status On-going               Plan - 02/26/15 9604    Clinical Impression Statement Pt doing very well with her advanced exercises for core stabs and spine ROM. Addressed some technique issues to improve her core contraction and keep back in an elongagted position. Pain not limiting today with exs. Added some siedelying exercises to challenge the core which was difficult.   Pt will benefit from skilled therapeutic intervention in order to improve on the following deficits Decreased activity tolerance;Decreased mobility;Decreased strength;Impaired flexibility;Pain;Increased muscle spasms;Decreased endurance;Decreased range of motion   Rehab Potential Excellent   Clinical Impairments Affecting Rehab Potential None   PT Frequency 2x / week   PT Duration 6 weeks   PT  Treatment/Interventions Cryotherapy;Electrical Stimulation;Moist Heat;Therapeutic exercise;Therapeutic activities;Ultrasound;Neuromuscular re-education;Patient/family education;Manual techniques   PT Next Visit Plan Core ex for strength        Problem List There are no active problems to display for this patient.   COCHRAN,JENNIFER, PTA 02/26/2015, 10:12 AM  [facility] [address] [place], [place], [address] Phone: [phone]   Fax:  [phone]

## 2015-03-04 ENCOUNTER — Ambulatory Visit: Payer: 59 | Admitting: Physical Therapy

## 2015-03-04 ENCOUNTER — Encounter: Payer: Self-pay | Admitting: Physical Therapy

## 2015-03-04 ENCOUNTER — Encounter: Payer: 59 | Admitting: Physical Therapy

## 2015-03-04 DIAGNOSIS — M545 Low back pain, unspecified: Secondary | ICD-10-CM

## 2015-03-04 NOTE — Patient Instructions (Signed)
Knee Extension (Sitting)    Place __0__ pound weight on left ankle and straighten knee fully, lower slowly. Repeat __20__ times per set. Do _1___ sets per session. Do _1___ sessions per day. Brace abdominals and contract pelvic floor http://orth.exer.us/732   Copyright  VHI. All rights reserved.  Knee: Flexion / Extension    Get ON TARGET. Sit up with shoulders down. Slide  one foot forward keeping abdominals braced and contract pelvic floor. No rollers  Repeat _10_ times. Do __1_ sessions per day.  Copyright  VHI. All rights reserved.  External Rotation: Hip - Knees Apart (Sitting)    Sit, slide one knee outward 10 x with pelvic floor contraction and abdominal bracing.Then do 10 times on other leg. Repeat _10__ times. Do _1__ times a day.  Copyright  VHI. All rights reserved.   PNF Strengthening: Resisted   Standing with resistive band around each hand, bring right arm up and away, thumb back. Repeat _10___ times per set. Do _1___ sets per session. Do _1-2___ sessions per day.      Resisted Horizontal Abduction: Bilateral   Sit or stand, tubing in both hands, arms out in front. Keeping arms straight, pinch shoulder blades together and stretch arms out. Repeat _10___ times per set. Do 1____ sets per session. Do _1-2___ sessions per day.  [facility] [address] [place], [place] [address] Phone # [phone] Fax [phone]

## 2015-03-04 NOTE — Therapy (Signed)
[facility] [address] [place], [place], [address] Phone: [phone]   Fax:  [phone]  Physical Therapy Treatment  Patient Details  Name: Kara Donaldson MRN: [id number] Date of Birth: [birth year] Referring Provider:  Farris Has, MD  Encounter Date: 03/04/2015      PT End of Session - 03/04/15 1236    Visit Number 8   Date for PT Re-Evaluation 03/18/15   PT Start Time 1230   PT Stop Time 1310   PT Time Calculation (min) 40 min   Activity Tolerance Patient tolerated treatment well   Behavior During Therapy [facility] for tasks assessed/performed      Past Medical History  Diagnosis Date  . Migraines   . Iron deficiency anemia     Past Surgical History  Procedure Laterality Date  . Right shoulder surgery      There were no vitals filed for this visit.  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    Limitations Sitting   How long can you sit comfortably? slightly   How long can you stand comfortably? increases pain   How long can you walk comfortably? pushing golf cart up hill   Patient Stated Goals play golf with less pain   Currently in Pain? Yes   Pain Score 2    Pain Location Back   Pain Orientation Right;Left   Pain Descriptors / Indicators Dull   Pain Type Chronic pain   Pain Onset More than a month ago   Pain Frequency Constant   Aggravating Factors  standing too long, driving golf cart   Pain Relieving Factors stretching   Effect of Pain on Daily Activities housework and gold   Multiple Pain Sites No                         [facility] Adult PT Treatment/Exercise - 03/04/15 0001    Lumbar Exercises: Seated   Long Arc Quad on Chair Right;Left;2 sets;10 reps  abdominal bracing, pelic   Lumbar Exercises: Supine   Clam 10 reps;1  second;Other (comment)  sitting, abdominal bracing, pelvic floor   Heel Slides 10 reps  sitting, each side, abdominal brace, pelvic floor    Knee/Hip Exercises: Aerobic   Nustep level 2 7 min   Shoulder Exercises: Seated   Horizontal ABduction Theraband;Right;Left;20 reps   Theraband Level (Shoulder Horizontal ABduction) Level 2 (Red)   Other Seated Exercises PNF diagonal red band with abdominal bracing in sitting 20x each                PT Education - 03/04/15 1310    Education provided Yes   Education Details sitting abdominal bracing exercises, interscapular strengthening exercises   Person(s) Educated Patient   Methods Explanation;Handout;Demonstration   Comprehension Returned demonstration;Verbalized understanding          PT Short Term Goals - 02/12/15 0936    PT SHORT TERM GOAL #1   Title understand correct body mechanics with daily tasks   Time 3   Period Weeks   Status Achieved  education provided today   PT SHORT TERM GOAL #2   Title pain with daily tasks decreased >/= 25%   Time 3   Period Weeks   Status Achieved  PT Long Term Goals - 02/24/15 1102    PT LONG TERM GOAL #1   Title independent with HEP and understand how to progress herself   Time 6   Period Weeks   Status On-going   PT LONG TERM GOAL #2   Title return to golf with minimal pain   Time 6   Period Weeks   Status On-going  tried 6 holes   PT LONG TERM GOAL #3   Title stand and push a vacuum and mop with minimal pain   Time 6   Period Weeks   Status On-going   PT LONG TERM GOAL #4   Title push golf bag with pain decreased >/= 50% so she can walk on golf course   Time 6   Period Weeks   Status On-going               Plan - 03/04/15 1313    Clinical Impression Statement Patient is learning back stabilization exercises in sitting.  Patient had some discomfort with heel slides in sitting. Patient continues to need core strengthening to reduce back pain.     Pt will benefit from skilled therapeutic intervention in order to improve on the following deficits Decreased activity tolerance;Decreased mobility;Decreased strength;Impaired flexibility;Pain;Increased muscle spasms;Decreased endurance;Decreased range of motion   Rehab Potential Excellent   Clinical Impairments Affecting Rehab Potential None   PT Frequency 2x / week   PT Duration 6 weeks   PT Treatment/Interventions Cryotherapy;Electrical Stimulation;Moist Heat;Therapeutic exercise;Therapeutic activities;Ultrasound;Neuromuscular re-education;Patient/family education;Manual techniques   PT Next Visit Plan Core ex for strength; renewal for 1 time per week for 4 weeks   PT Home Exercise Plan progress as needed   Consulted and Agree with Plan of Care Patient        Problem List There are no active problems to display for this patient.   Phi Avans,PT 03/04/2015, 1:16 PM  [facility] [address], [address] [place], [place], [address] Phone: [phone]   Fax:  [phone]

## 2015-03-07 ENCOUNTER — Encounter: Payer: Self-pay | Admitting: Physical Therapy

## 2015-03-07 ENCOUNTER — Ambulatory Visit: Payer: 59 | Admitting: Physical Therapy

## 2015-03-07 DIAGNOSIS — M545 Low back pain, unspecified: Secondary | ICD-10-CM

## 2015-03-07 NOTE — Therapy (Addendum)
[facility] [address], [place], [place], [address] Phone: [phone]   Fax:  [phone]  Physical Therapy Treatment  Patient Details  Name: Kara Donaldson MRN: [id number] Date of Birth: 12-Jan-1961 No Data Recorded  Encounter Date: 03/07/2015      PT End of Session - 03/07/15 1021    Visit Number 9   Date for PT Re-Evaluation 03/18/15   PT Start Time 5284   PT Stop Time 1055   PT Time Calculation (min) 40 min   Activity Tolerance Patient tolerated treatment well   Behavior During Therapy [facility] for tasks assessed/performed      Past Medical History  Diagnosis Date  . Migraines   . Iron deficiency anemia     Past Surgical History  Procedure Laterality Date  . Right shoulder surgery      There were no vitals filed for this visit.  Visit Diagnosis:  Bilateral low back pain without sciatica      Subjective Assessment - 03/07/15 1022    Subjective I played golf and had pain 8 hours later and went to 5/10.    Limitations Sitting   How long can you sit comfortably? slightly   How long can you stand comfortably? increases pain   How long can you walk comfortably? pushing golf cart up hill   Patient Stated Goals play golf with less pain   Currently in Pain? Yes   Pain Score 5    Pain Location Back   Pain Orientation Right;Left   Pain Descriptors / Indicators Dull   Pain Type Chronic pain   Pain Onset More than a month ago   Pain Frequency Constant   Aggravating Factors  standing too long, driving golf cart   Pain Relieving Factors stretching   Effect of Pain on Daily Activities housework and golf   Multiple Pain Sites No                         [facility] Adult PT Treatment/Exercise - 03/07/15 0001    Lumbar Exercises: Aerobic   Elliptical level 1 x 6 min   Lumbar Exercises: Standing   Scapular Retraction Strengthening;Power Tower;Both;10 reps  20# 3 sets   Other Standing Lumbar  Exercises horizontal abduction with pulleys 5# bil. 20x   Other Standing Lumbar Exercises squat lift red plyoball 10x; squat while moving red plyoball back and forth; squat hold red plyoball moving up nd down in midrange   Lumbar Exercises: Quadruped   Opposite Arm/Leg Raise 10 reps;Right arm/Left leg;Left arm/Right leg;Other (comment)  1 poun each hand   Knee/Hip Exercises: Supine   Single Leg Bridge Right;Left;5 reps;1 set                PT Education - 03/07/15 1049    Education provided No          PT Short Term Goals - 02/12/15 0936    PT SHORT TERM GOAL #1   Title understand correct body mechanics with daily tasks   Time 3   Period Weeks   Status Achieved  education provided today   PT SHORT TERM GOAL #2   Title pain with daily tasks decreased >/= 25%   Time 3   Period Weeks   Status Achieved           PT Long Term Goals - 02/24/15 1102    PT LONG TERM GOAL #1   Title independent with HEP  and understand how to progress herself   Time 6   Period Weeks   Status On-going   PT LONG TERM GOAL #2   Title return to golf with minimal pain   Time 6   Period Weeks   Status On-going  tried 6 holes   PT LONG TERM GOAL #3   Title stand and push a vacuum and mop with minimal pain   Time 6   Period Weeks   Status On-going   PT LONG TERM GOAL #4   Title push golf bag with pain decreased >/= 50% so she can walk on golf course   Time 6   Period Weeks   Status On-going               Plan - 03/07/15 1049    Clinical Impression Statement Patient is a 54 year old female with diagnosis of back pain.  Patient  is now able to do higher level back strengthening.  Patient is using home TENS unit to manage her pain.  Patient is only able to play 9 holes of golf due to pain.  Patient benefits fromphysical therapy to improve back stabilization and reduce pain.    Pt will benefit from skilled therapeutic intervention in order to improve on the following deficits  Decreased activity tolerance;Decreased mobility;Decreased strength;Impaired flexibility;Pain;Increased muscle spasms;Decreased endurance;Decreased range of motion   Rehab Potential Excellent   Clinical Impairments Affecting Rehab Potential None   PT Frequency 2x / week   PT Duration 6 weeks   PT Treatment/Interventions Cryotherapy;Electrical Stimulation;Moist Heat;Therapeutic exercise;Therapeutic activities;Ultrasound;Neuromuscular re-education;Patient/family education;Manual techniques   PT Next Visit Plan Core ex for strength; renewal for 1 time per week for 4 weeks   PT Home Exercise Plan progress as needed   Consulted and Agree with Plan of Care Patient        Problem List There are no active problems to display for this patient.   Kara Donaldson,PT 03/07/2015, 10:51 AM  [name] [address], [place], [place], [address] Phone: [phone]   Fax:  [phone]  Name: Kara Donaldson MRN: [id number] Date of Birth: [birth year]    PHYSICAL THERAPY DISCHARGE SUMMARY  Visits from Start of Care: 9  Current functional level related to goals / functional outcomes: See above. Patient has not met LTG's #1,2,3,4 due to not returning to therapy.    Remaining deficits: See above.  Patient wanted to be discharged on 03/11/2015 due to going to a different therapy.  Patient was not able to do 18 holes of golf abut able to do 9 holes.     Education / Equipment: HEP Plan: Patient agrees to discharge.  Patient goals were partially met. Patient is being discharged due to the patient's request. Thank you for the referral. Kara Donaldson, PT 03/12/2015 2:00 PM   ?????

## 2015-03-12 ENCOUNTER — Ambulatory Visit: Payer: 59 | Admitting: Physical Therapy

## 2015-03-17 ENCOUNTER — Ambulatory Visit: Payer: Self-pay | Admitting: Physical Therapy

## 2016-03-31 ENCOUNTER — Other Ambulatory Visit: Payer: Self-pay | Admitting: Obstetrics & Gynecology

## 2016-03-31 DIAGNOSIS — Z1231 Encounter for screening mammogram for malignant neoplasm of breast: Secondary | ICD-10-CM

## 2016-05-04 ENCOUNTER — Ambulatory Visit
Admission: RE | Admit: 2016-05-04 | Discharge: 2016-05-04 | Disposition: A | Discharge status: Home / Self Care | Payer: 59 | Source: Ambulatory Visit | Attending: Obstetrics & Gynecology | Admitting: Obstetrics & Gynecology

## 2016-05-04 DIAGNOSIS — Z1231 Encounter for screening mammogram for malignant neoplasm of breast: Secondary | ICD-10-CM

## 2017-05-31 ENCOUNTER — Other Ambulatory Visit: Payer: Self-pay

## 2017-05-31 ENCOUNTER — Emergency Department (HOSPITAL_BASED_OUTPATIENT_CLINIC_OR_DEPARTMENT_OTHER): Payer: Managed Care, Other (non HMO)

## 2017-05-31 ENCOUNTER — Emergency Department (HOSPITAL_BASED_OUTPATIENT_CLINIC_OR_DEPARTMENT_OTHER)
Admission: EM | Admit: 2017-05-31 | Discharge: 2017-05-31 | Disposition: A | Discharge status: Home / Self Care | Payer: Managed Care, Other (non HMO) | Attending: Emergency Medicine | Admitting: Emergency Medicine

## 2017-05-31 ENCOUNTER — Encounter (HOSPITAL_BASED_OUTPATIENT_CLINIC_OR_DEPARTMENT_OTHER): Payer: Self-pay | Admitting: *Deleted

## 2017-05-31 DIAGNOSIS — Z5321 Procedure and treatment not carried out due to patient leaving prior to being seen by health care provider: Secondary | ICD-10-CM | POA: Insufficient documentation

## 2017-05-31 DIAGNOSIS — R109 Unspecified abdominal pain: Secondary | ICD-10-CM | POA: Insufficient documentation

## 2017-05-31 NOTE — ED Triage Notes (Signed)
Pt c/o diffuse abd pain ? Constipation x 3 days

## 2017-05-31 NOTE — ED Notes (Signed)
Pt states that she is leaving, "I've been waiting too long, this is ridiculous." pt informed that she is next to go back, as soon as a room is available. Pt encouraged to stay and await md eval, refuses, "I can't wait any longer. I have to go." pt leaves ER with quick steady gait in nad.

## 2018-01-18 ENCOUNTER — Other Ambulatory Visit: Payer: Self-pay | Admitting: Podiatry

## 2018-01-18 ENCOUNTER — Ambulatory Visit (INDEPENDENT_AMBULATORY_CARE_PROVIDER_SITE_OTHER): Payer: Managed Care, Other (non HMO)

## 2018-01-18 ENCOUNTER — Encounter: Payer: Self-pay | Admitting: Podiatry

## 2018-01-18 ENCOUNTER — Other Ambulatory Visit: Payer: Self-pay | Admitting: *Deleted

## 2018-01-18 ENCOUNTER — Ambulatory Visit: Payer: Managed Care, Other (non HMO) | Admitting: Podiatry

## 2018-01-18 ENCOUNTER — Encounter: Payer: Self-pay | Admitting: *Deleted

## 2018-01-18 DIAGNOSIS — M7752 Other enthesopathy of left foot: Secondary | ICD-10-CM | POA: Diagnosis not present

## 2018-01-18 DIAGNOSIS — M79671 Pain in right foot: Secondary | ICD-10-CM

## 2018-01-18 DIAGNOSIS — M779 Enthesopathy, unspecified: Secondary | ICD-10-CM

## 2018-01-18 DIAGNOSIS — M25572 Pain in left ankle and joints of left foot: Secondary | ICD-10-CM

## 2018-01-18 DIAGNOSIS — M7751 Other enthesopathy of right foot: Secondary | ICD-10-CM

## 2018-01-18 MED ORDER — TRIAMCINOLONE ACETONIDE 10 MG/ML IJ SUSP
10.0000 mg | Freq: Once | INTRAMUSCULAR | Status: AC
  Administered 2018-01-18: 10 mg

## 2018-01-18 MED ORDER — DICLOFENAC SODIUM 75 MG PO TBEC: 75.0000 mg | DELAYED_RELEASE_TABLET | Freq: Two times a day (BID) | ORAL | 2 refills | Status: AC

## 2018-01-18 NOTE — Progress Notes (Signed)
Subjective:   Patient ID: Kara Donaldson, female   DOB: [birth year]89 y.o.   MRN: [id number]   HPI Patient states she is been having a lot of pain on the outside of the right foot for the last 3 months and on the left the inside of the ankle has given her trouble for several years secondary to an injury but it is mild in its intensity.  Patient does not smoke likes to be active and states the right one is really impeding her activity levels   Review of Systems  All other systems reviewed and are negative.       Objective:  Physical Exam  Constitutional: She appears well-developed and well-nourished.  Cardiovascular: Intact distal pulses.  Pulmonary/Chest: Effort normal.  Musculoskeletal: Normal range of motion.  Neurological: She is alert.  Skin: Skin is warm.  Nursing note and vitals reviewed.   Neurovascular status intact muscle strength is adequate range of motion within normal limits with patient noted to have discomfort on the medial side of the left ankle with inflammation fluid buildup and on the right fifth metatarsal base there is inflammation fluid that is very painful when palpated.  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  Placed on diclofenac 75 mg twice daily and reappoint to recheck 2 weeks  X-rays indicate that the patient does have moderate depression of the arch and some irritation of the base the fifth metatarsal  right with no fracture noted

## 2018-01-18 NOTE — Patient Instructions (Signed)
Tendinitis Tendinitis is inflammation of a tendon. A tendon is a strong cord of tissue that connects muscle to bone. Tendinitis can affect any tendon, but it most commonly affects the shoulder tendon (rotator cuff), ankle tendon (Achilles tendon), elbow tendon (triceps tendon), or one of the tendons in the wrist. What are the causes? This condition may be caused by:  Overusing a tendon or muscle. This is common.  Age-related wear and tear.  Injury.  Inflammatory conditions, such as arthritis.  Certain medicines.  What increases the risk? This condition is more likely to develop in people who do activities that involve repetitive motions. What are the signs or symptoms? Symptoms of this condition may include:  Pain.  Tenderness.  Mild swelling.  How is this diagnosed? This condition is diagnosed with a physical exam. You may also have tests, such as:  Ultrasound. This uses sound waves to make an image of your affected area.  MRI.  How is this treated? This condition may be treated by resting, icing, applying pressure (compression), and raising (elevating) the area above the level of your heart. This is known as RICE therapy. Treatment may also include:  Medicines to help reduce inflammation or to help reduce pain.  Exercises or physical therapy to strengthen and stretch the tendon.  A brace or splint.  Surgery (rare).  Follow these instructions at home:  If you have a splint or brace:  Wear the splint or brace as told by your health care provider. Remove it only as told by your health care provider.  Loosen the splint or brace if your fingers or toes tingle, become numb, or turn cold and blue.  Do not take baths, swim, or use a hot tub until your health care provider approves. Ask your health care provider if you can take showers. You may only be allowed to take sponge baths for bathing.  Do not let your splint or brace get wet if it is not waterproof. ? If your  splint or brace is not waterproof, cover it with a watertight plastic bag when you take a bath or a shower.  Keep the splint or brace clean. Managing pain, stiffness, and swelling  If directed, apply ice to the affected area. ? Put ice in a plastic bag. ? Place a towel between your skin and the bag. ? Leave the ice on for 20 minutes, 2-3 times a day.  If directed, apply heat to the affected area as often as told by your health care provider. Use the heat source that your health care provider recommends, such as a moist heat pack or a heating pad. ? Place a towel between your skin and the heat source. ? Leave the heat on for 20-30 minutes. ? Remove the heat if your skin turns bright red. This is especially important if you are unable to feel pain, heat, or cold. You may have a greater risk of getting burned.  Move the fingers or toes of the affected limb often, if this applies. This can help to prevent stiffness and lessen swelling.  If directed, elevate the affected area above the level of your heart while you are sitting or lying down. Driving  Do not drive or operate heavy machinery while taking prescription pain medicine.  Ask your health care provider when it is safe to drive if you have a splint or brace on any part of your arm or leg. Activity  Return to your normal activities as told by your health care   provider. Ask your health care provider what activities are safe for you.  Rest the affected area as told by your health care provider.  Avoid using the affected area while you are experiencing symptoms of tendinitis.  Do exercises as told by your health care provider. General instructions  If you have a splint, do not put pressure on any part of the splint until it is fully hardened. This may take several hours.  Wear an elastic bandage or compression wrap only as told by your health care provider.  Take over-the-counter and prescription medicines only as told by your  health care provider.  Keep all follow-up visits as told by your health care provider. This is important. Contact a health care provider if:  Your symptoms do not improve.  You develop new, unexplained problems, such as numbness in your hands. This information is not intended to replace advice given to you by your health care provider. Make sure you discuss any questions you have with your health care provider. Document Released: 05/07/2000 Document Revised: 01/08/2016 Document Reviewed: 02/10/2015 Elsevier Interactive Patient Education  2018 [employer]vier Inc[employer]  

## 2018-02-01 ENCOUNTER — Ambulatory Visit: Payer: Managed Care, Other (non HMO) | Admitting: Podiatry

## 2018-02-01 ENCOUNTER — Encounter: Payer: Self-pay | Admitting: Podiatry

## 2018-02-01 DIAGNOSIS — M779 Enthesopathy, unspecified: Secondary | ICD-10-CM | POA: Diagnosis not present

## 2018-02-02 NOTE — Progress Notes (Signed)
Subjective:   Patient ID: [name], female   DOB: 57 y.o.   MRN: [id number]   HPI Patient states she is feeling a lot better with reduced discomfort and able to walk and states the brace is really help   ROS      Objective:  Physical Exam  Neurovascular status intact with patient foot pain improved quite a bit with diminishment of tendinitis-like symptomatology     Assessment:  Tendinitis present with reduced symptoms noted currently     Plan:  H&P condition reviewed and recommended long-term support therapy and patient will use at home support and we may end up making orthotics if symptoms were to get worse or become prevalent again.  Other than that patient is having good recovery from acute inflammation

## 2018-06-15 ENCOUNTER — Other Ambulatory Visit: Payer: Self-pay | Admitting: Obstetrics & Gynecology

## 2018-06-15 ENCOUNTER — Other Ambulatory Visit: Payer: Self-pay | Admitting: Obstetrics

## 2018-06-15 DIAGNOSIS — Z1231 Encounter for screening mammogram for malignant neoplasm of breast: Secondary | ICD-10-CM

## 2018-07-12 ENCOUNTER — Ambulatory Visit
Admission: RE | Admit: 2018-07-12 | Discharge: 2018-07-12 | Disposition: A | Discharge status: Home / Self Care | Payer: Managed Care, Other (non HMO) | Source: Ambulatory Visit | Attending: Obstetrics | Admitting: Obstetrics

## 2018-07-12 DIAGNOSIS — Z1231 Encounter for screening mammogram for malignant neoplasm of breast: Secondary | ICD-10-CM

## 2020-05-22 ENCOUNTER — Other Ambulatory Visit: Payer: Self-pay | Admitting: Orthopedic Surgery

## 2020-05-22 DIAGNOSIS — M25512 Pain in left shoulder: Secondary | ICD-10-CM

## 2020-05-31 ENCOUNTER — Ambulatory Visit: Payer: Managed Care, Other (non HMO)

## 2020-06-15 ENCOUNTER — Other Ambulatory Visit: Payer: Self-pay

## 2020-06-15 ENCOUNTER — Ambulatory Visit
Admission: RE | Admit: 2020-06-15 | Discharge: 2020-06-15 | Disposition: A | Discharge status: Home / Self Care | Payer: Managed Care, Other (non HMO) | Source: Ambulatory Visit | Attending: Orthopedic Surgery | Admitting: Orthopedic Surgery

## 2020-06-15 DIAGNOSIS — M25512 Pain in left shoulder: Secondary | ICD-10-CM

## 2021-05-24 HISTORY — PX: JOINT REPLACEMENT: SHX530

## 2022-07-30 ENCOUNTER — Other Ambulatory Visit (HOSPITAL_BASED_OUTPATIENT_CLINIC_OR_DEPARTMENT_OTHER): Payer: Self-pay | Admitting: Family Medicine

## 2022-07-30 DIAGNOSIS — E785 Hyperlipidemia, unspecified: Secondary | ICD-10-CM

## 2022-08-06 ENCOUNTER — Ambulatory Visit (HOSPITAL_BASED_OUTPATIENT_CLINIC_OR_DEPARTMENT_OTHER)
Admission: RE | Admit: 2022-08-06 | Discharge: 2022-08-06 | Disposition: A | Discharge status: Home / Self Care | Payer: Self-pay | Source: Ambulatory Visit | Attending: Family Medicine | Admitting: Family Medicine

## 2022-08-06 DIAGNOSIS — E785 Hyperlipidemia, unspecified: Secondary | ICD-10-CM | POA: Insufficient documentation

## 2022-08-19 IMAGING — MR MR SHOULDER*L* W/O CM
5 series · 34 of 40 positions shown · non-contrast
Comparison: None.

CLINICAL DATA: Left shoulder pain extending down to the elbow.
Instability. Swelling along the biceps. Reduced range of motion.
Fall in June 2019. Golf injury.

EXAM:
MRI OF THE LEFT SHOULDER WITHOUT CONTRAST
TECHNIQUE: Multiplanar, multisequence MR imaging of the shoulder was performed.
No intravenous contrast was administered.

[Series 3: T2 fat-sat · axial · 4.0mm · 0.55mm/px · z∈[-22,+98]mm · 8 of 26 slices shown (1 of 3)]
[im 1/26]
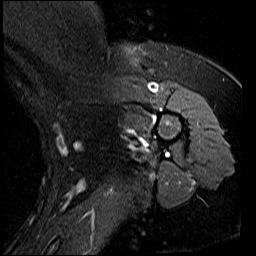
[im 3/26]
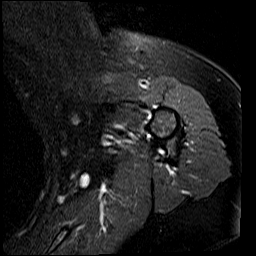
[im 9/26]
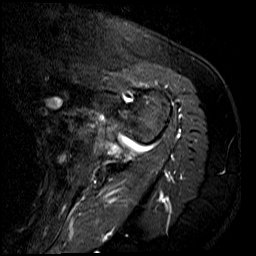
[im 12/26]
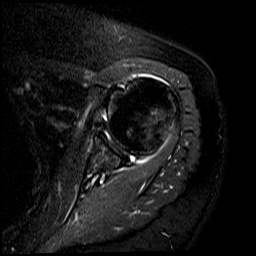
[im 14/26]
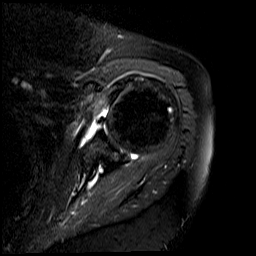
[im 17/26]
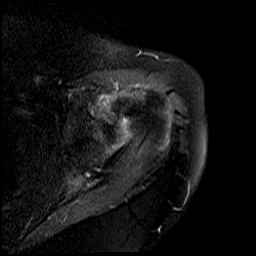
[im 23/26]
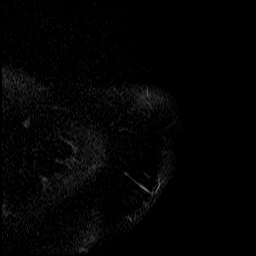
[im 26/26]
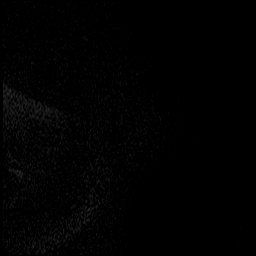

[Series 4: T2 fat-sat · sagittal · 4.0mm · 0.55mm/px · 7 of 21 slices shown (2 of 3)]
[im 1/21]
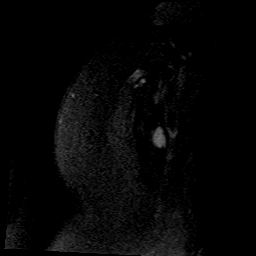
[im 4/21]
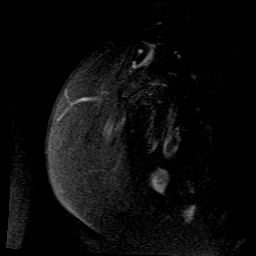
[im 7/21]
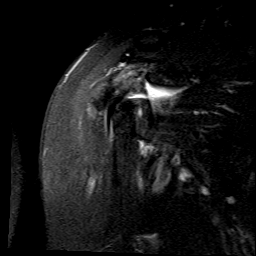
[im 11/21]
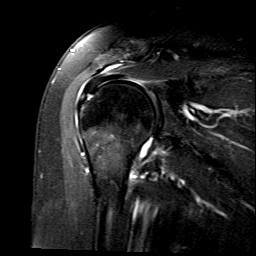
[im 14/21]
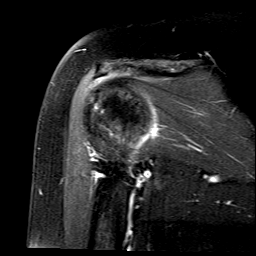
[im 17/21]
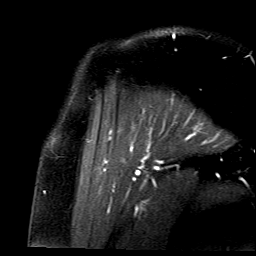
[im 21/21]
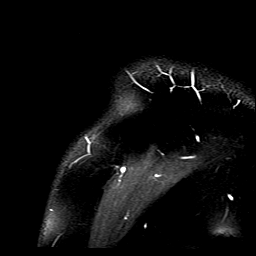

[Series 5: PD · sagittal · 4.0mm · 0.27mm/px · 7 of 21 slices shown]
[im 1/21]
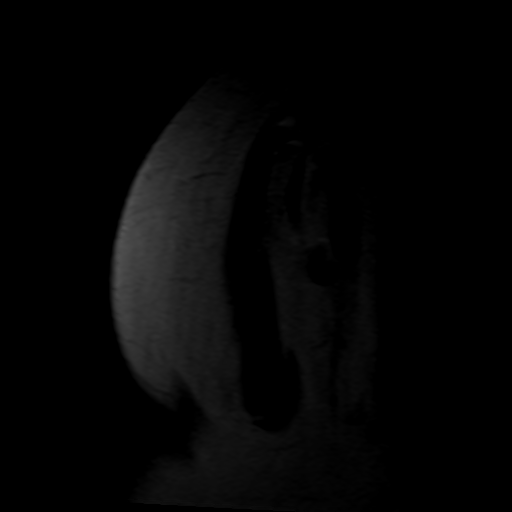
[im 4/21]
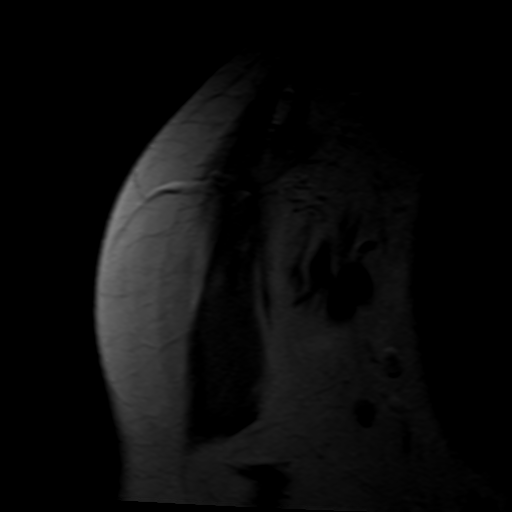
[im 7/21]
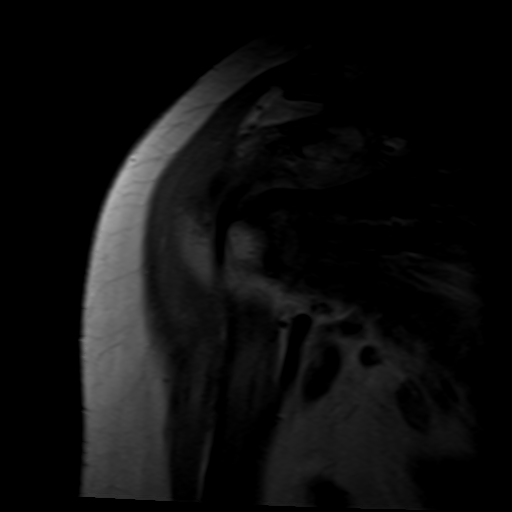
[im 11/21]
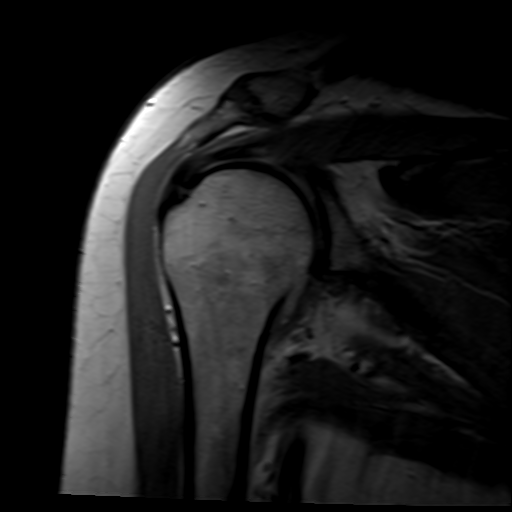
[im 14/21]
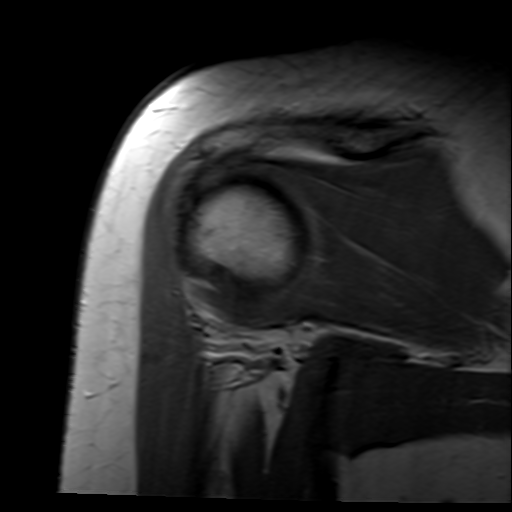
[im 17/21]
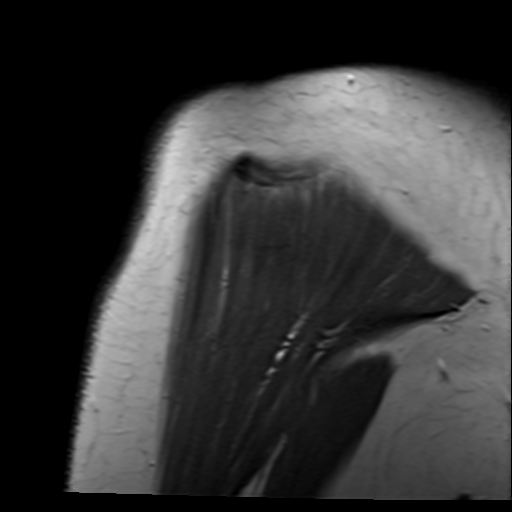
[im 21/21]
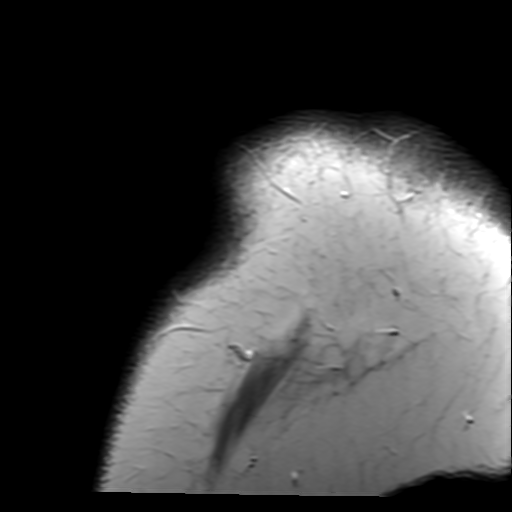

[Series 6: T2 fat-sat · oblique · 4.0mm · 0.55mm/px · 8 of 25 slices shown (3 of 3)]
[im 1/25]
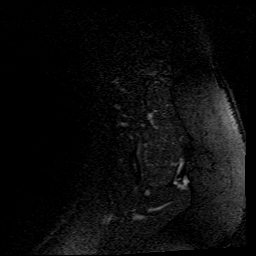
[im 4/25]
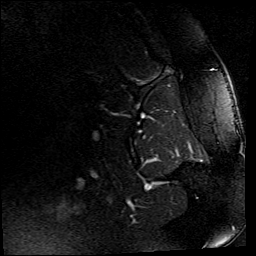
[im 7/25]
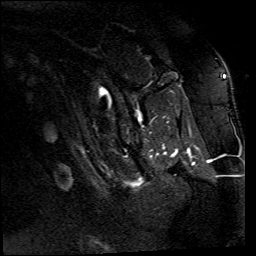
[im 11/25]
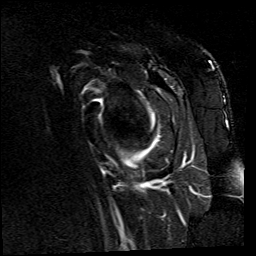
[im 14/25]
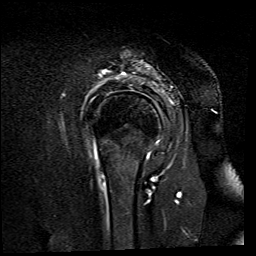
[im 18/25]
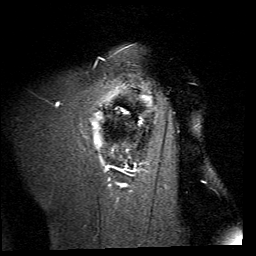
[im 21/25]
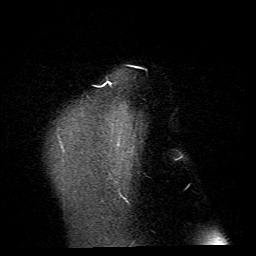
[im 25/25]
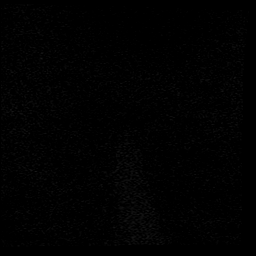

[Series 7: T1 · oblique · 4.0mm · 0.27mm/px · 4 of 25 slices shown]
[im 1/25]
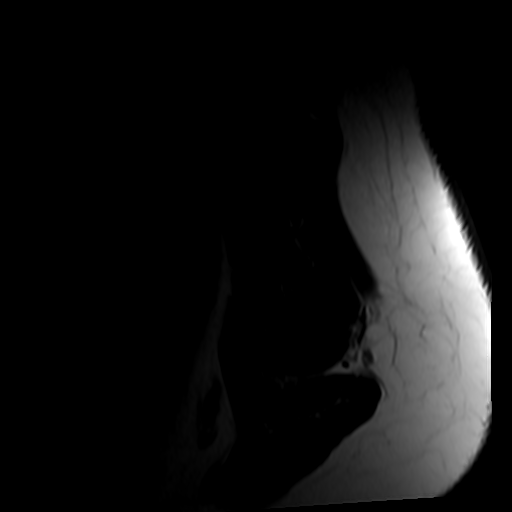
[im 4/25]
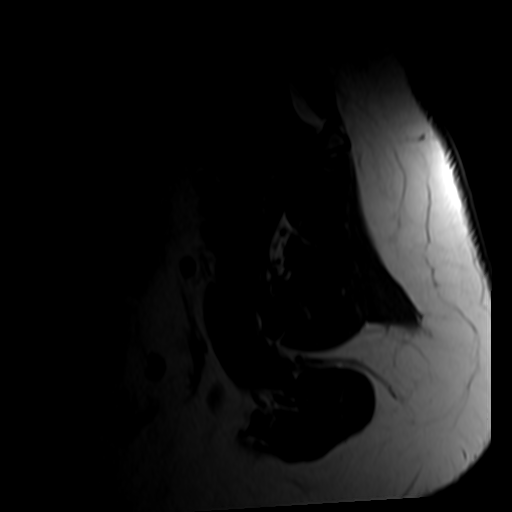
[im 7/25]
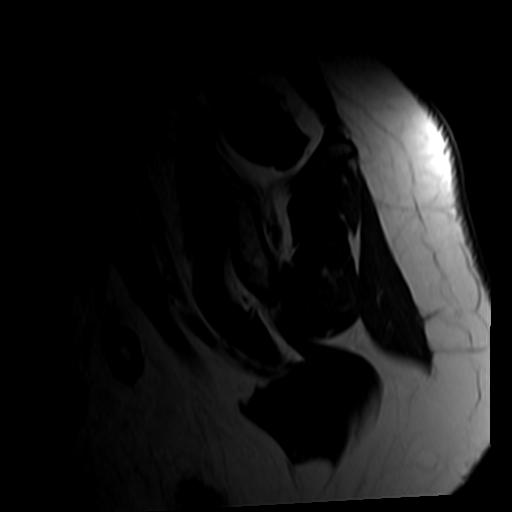
[im 11/25]
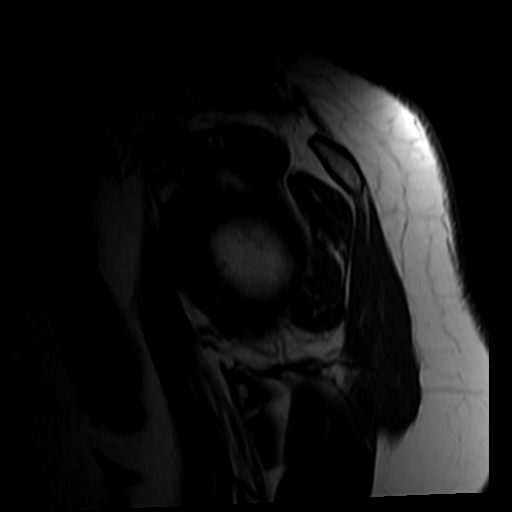

[34 of 40 positions shown; findings below may reference images not displayed]

FINDINGS: Rotator cuff: Mild fissuring in the distal anterior supraspinatus
tendon as on image 17 of series 6, compatible with tendinopathy.

Muscles:  Unremarkable

Biceps long head:  Unremarkable

Acromioclavicular Joint: Mild spurring and minimal degenerative
subcortical marrow edema compatible with mild degenerative AC joint
arthropathy. Type II acromion. Mild subacromial subdeltoid bursitis.

Glenohumeral Joint: There is synovitis in the rotator interval.
Upper normal amount of fluid in the glenohumeral joint. Mild to
moderate degenerative chondral thinning.

Labrum: Linear accentuated signal along the posterior labrum for
example on image 10 of series 6 and images 12-13 of series 3,
posterior labral tear is a distinct possibility.

Bones: No significant extra-articular osseous abnormalities
identified.

Other: No supplemental non-categorized findings.
IMPRESSION: 1. Linear accentuated signal along the posterior labrum, suspicious
for a posterior labral tear.
2. Mild subacromial subdeltoid bursitis.
3. Mild fissuring in the distal anterior supraspinatus tendon
compatible with tendinopathy.
4. There is synovitis along the rotator interval. Although this can
be associated with adhesive capsulitis, there is also an upper
normal amount of fluid in the glenohumeral joint which would be
unusual for adhesive capsulitis.
5. Mild to moderate degenerative chondral thinning in the
glenohumeral joint. Mild degenerative AC joint arthropathy.

## 2023-05-13 ENCOUNTER — Other Ambulatory Visit: Payer: Self-pay | Admitting: *Deleted

## 2023-05-13 DIAGNOSIS — I8393 Asymptomatic varicose veins of bilateral lower extremities: Secondary | ICD-10-CM

## 2023-05-23 ENCOUNTER — Ambulatory Visit (HOSPITAL_COMMUNITY)
Admission: RE | Admit: 2023-05-23 | Discharge: 2023-05-23 | Disposition: A | Discharge status: Home / Self Care | Payer: No Typology Code available for payment source | Source: Ambulatory Visit | Attending: Surgery | Admitting: Surgery

## 2023-05-23 DIAGNOSIS — I8393 Asymptomatic varicose veins of bilateral lower extremities: Secondary | ICD-10-CM | POA: Insufficient documentation

## 2023-05-26 ENCOUNTER — Encounter: Payer: Self-pay | Admitting: Physician Assistant

## 2023-05-26 ENCOUNTER — Ambulatory Visit (INDEPENDENT_AMBULATORY_CARE_PROVIDER_SITE_OTHER): Payer: No Typology Code available for payment source | Admitting: Physician Assistant

## 2023-05-26 VITALS — BP 124/79 | HR 75 | Temp 98.1°F | Resp 18 | Ht 68.0 in | Wt 134.2 lb

## 2023-05-26 DIAGNOSIS — I872 Venous insufficiency (chronic) (peripheral): Secondary | ICD-10-CM | POA: Diagnosis not present

## 2023-05-26 NOTE — Progress Notes (Signed)
 Requested by:  Kip Righter, MD [address] [place],  [place] [address]  Reason for consultation: spider veins   History of Present Illness   Kara Donaldson is a 63 y.o. (09-Dec-1960) female who presents for evaluation of spider veins. She states she has had spider veins for several years. More have popped up over time. These will occasionally cause her itching and discomfort. She also endorses occasional bilateral lower extremity swelling, usually around her ankles. This is worse at the end of the day after prolonged sitting and standing. She does wear compression stockings, but usually only in the winter. Her leg swelling does benefit from compression stockings. She does not regularly elevate her legs.  She also reports difficulty with restless legs for the past couple of months, which is worse on the left.   She denies any prior history of DVT or previous vein procedures.  Past Medical History:  Diagnosis Date   Iron deficiency anemia    Migraines     Past Surgical History:  Procedure Laterality Date   right shoulder surgery      Social History   Socioeconomic History   Marital status: Married    Spouse name: Not on file   Number of children: Not on file   Years of education: Not on file   Highest education level: Not on file  Occupational History   Not on file  Tobacco Use   Smoking status: Never   Smokeless tobacco: Never  Substance and Sexual Activity   Alcohol use: No   Drug use: No   Sexual activity: Never    Birth control/protection: None  Other Topics Concern   Not on file  Social History Narrative   Not on file   Social Drivers of Health   Financial Resource Strain: Not on file  Food Insecurity: Not on file  Transportation Needs: Not on file  Physical Activity: Not on file  Stress: Not on file  Social Connections: Not on file  Intimate Partner Violence: Not on file   No family history on file.  Current Outpatient  Medications  Medication Sig Dispense Refill   diclofenac  (VOLTAREN ) 75 MG EC tablet Take 1 tablet (75 mg total) by mouth 2 (two) times daily. 50 tablet 2   No current facility-administered medications for this visit.    Allergies  Allergen Reactions   Codeine Nausea And Vomiting    REVIEW OF SYSTEMS (negative unless checked):   Cardiac:  []  Chest pain or chest pressure? []  Shortness of breath upon activity? []  Shortness of breath when lying flat? []  Irregular heart rhythm?  Vascular:  []  Pain in calf, thigh, or hip brought on by walking? []  Pain in feet at night that wakes you up from your sleep? []  Blood clot in your veins? []  Leg swelling?  Pulmonary:  []  Oxygen at home? []  Productive cough? []  Wheezing?  Neurologic:  []  Sudden weakness in arms or legs? []  Sudden numbness in arms or legs? []  Sudden onset of difficult speaking or slurred speech? []  Temporary loss of vision in one eye? []  Problems with dizziness?  Gastrointestinal:  []  Blood in stool? []  Vomited blood?  Genitourinary:  []  Burning when urinating? []  Blood in urine?  Psychiatric:  []  Major depression  Hematologic:  []  Bleeding problems? []  Problems with blood clotting?  Dermatologic:  []  Rashes or ulcers?  Constitutional:  []  Fever or chills?  Ear/Nose/Throat:  []  Change in hearing? []  Nose bleeds? []  Sore  throat?  Musculoskeletal:  []  Back pain? []  Joint pain? []  Muscle pain?   Physical Examination     Vitals:   05/26/23 0850  BP: 124/79  Pulse: 75  Resp: 18  Temp: 98.1 F (36.7 C)  TempSrc: Temporal  SpO2: 98%  Weight: 134 lb 3.2 oz (60.9 kg)  Height: 5' 8 (1.727 m)   Body mass index is 20.41 kg/m.  General:  WDWN in NAD; vital signs documented above Gait: Not observed HENT: WNL, normocephalic Pulmonary: normal non-labored breathing , without Rales, rhonchi,  wheezing Cardiac: regular Abdomen: soft, NT, no masses Skin: without rashes Vascular Exam/Pulses:  palpable pedal pulses Extremities: without varicose veins, with reticular veins, without edema, without stasis pigmentation, without lipodermatosclerosis, without ulcers Musculoskeletal: no muscle wasting or atrophy  Neurologic: A&O X 3;  No focal weakness or paresthesias are detected Psychiatric:  The pt has Normal affect.  Non-invasive Vascular Imaging   LLE Venous Insufficiency Duplex (05/23/2023):  +--------------+---------+------+-----------+------------+--------+  LEFT         Reflux NoRefluxReflux TimeDiameter cmsComments                          Yes                                   +--------------+---------+------+-----------+------------+--------+  CFV                    yes   >1 second                       +--------------+---------+------+-----------+------------+--------+  FV prox       no                                              +--------------+---------+------+-----------+------------+--------+  FV mid        no                                              +--------------+---------+------+-----------+------------+--------+  FV dist       no                                              +--------------+---------+------+-----------+------------+--------+  Popliteal              yes   >1 second                       +--------------+---------+------+-----------+------------+--------+  GSV at SFJ              yes    >500 ms      0.63              +--------------+---------+------+-----------+------------+--------+  GSV prox thigh                              0.38              +--------------+---------+------+-----------+------------+--------+  GSV mid thigh  0.24              +--------------+---------+------+-----------+------------+--------+  GSV dist thigh                              0.31              +--------------+---------+------+-----------+------------+--------+   GSV at knee                    >500 ms      0.32              +--------------+---------+------+-----------+------------+--------+  GSV prox calf                               0.19              +--------------+---------+------+-----------+------------+--------+  SSV Pop Fossa no                            0.45              +--------------+---------+------+-----------+------------+--------+  SSV prox calf no                            0.26              +--------------+---------+------+-----------+------------+--------+    Medical Decision Making   Kara Donaldson is a 63 y.o. female who presents for evaluation of venous insufficiency  Based on the patient's duplex, there is reflux in the left common femoral vein, popliteal vein, and greater saphenous vein at the saphenofemoral junction and at the knee. Given that the patient's GSV is competent throughout most of the thigh, she would not be a candidate for ablation She reports a chronic history of spider veins and mild lower extremity swelling. This will occasionally cause her discomfort. Her leg swelling is improved by compression stockings.  On exam she does not have any significant lower extremity swelling. She does have some small, scattered spider veins. She is not interested in treatment for these spider veins at this time She has also been struggling with what sounds like restless leg sensations for the last couple of months. I explained to her that venous insufficiency could be contributing to her discomfort, but this is likely not the cause of her restlessness. I have recommended she wear compression stockings daily, elevate her legs above her heart, exercise, and avoid prolonged sitting and standing to help with her edema. She can follow up with our office as needed   Kara SHAUNNA Holster, PA-C [facility] Office: [phone]  05/26/2023, 7:17 AM  Clinic MD: [place]

## 2024-03-25 ENCOUNTER — Ambulatory Visit

## 2024-03-25 ENCOUNTER — Ambulatory Visit
Admission: EM | Admit: 2024-03-25 | Discharge: 2024-03-25 | Disposition: A | Discharge status: Home / Self Care | Attending: Family Medicine | Admitting: Family Medicine

## 2024-03-25 ENCOUNTER — Ambulatory Visit: Payer: Self-pay | Admitting: Nurse Practitioner

## 2024-03-25 DIAGNOSIS — W108XXA Fall (on) (from) other stairs and steps, initial encounter: Secondary | ICD-10-CM

## 2024-03-25 DIAGNOSIS — S59911A Unspecified injury of right forearm, initial encounter: Secondary | ICD-10-CM

## 2024-03-25 DIAGNOSIS — M25552 Pain in left hip: Secondary | ICD-10-CM | POA: Diagnosis not present

## 2024-03-25 DIAGNOSIS — M79631 Pain in right forearm: Secondary | ICD-10-CM

## 2024-03-25 DIAGNOSIS — S79912A Unspecified injury of left hip, initial encounter: Secondary | ICD-10-CM

## 2024-03-25 NOTE — ED Triage Notes (Signed)
 Pt reports left hip pain and right lower arm since she fell going downstairs and missed 3 step around 0900 am today. Pain in hip is worse being sitting; movemnet makes right arm pain worse. Pt took Advil nd ice compress.

## 2024-03-25 NOTE — ED Provider Notes (Signed)
 [facility]    CSN: [id number] Arrival date & time: 03/25/24  1101      History   Chief Complaint Chief Complaint  Patient presents with   Fall    HPI Kara Donaldson is a 63 y.o. female presents for follow-up.  Patient states due to few hours prior to arrival she was going down her stairs when she missed a step and fell 3 steps.  States she injured her right forearm and her left hip.  She denies head injury or LOC.  She is not on blood thinning medications.  Reports pain to her mid forearm and to her posterior left hip.  Denies bruising or swelling of the areas.  No numbness or tingling.  No history of fractures or surgeries to these areas in the past.  No wrist, hand, abdominal, neck or back pain.  She took Advil for her symptoms.  No other injuries or concerns at this time   Fall    Past Medical History:  Diagnosis Date   Iron deficiency anemia    Migraines     There are no active problems to display for this patient.   Past Surgical History:  Procedure Laterality Date   JOINT REPLACEMENT Left 2023   Left Shoulder bone spurs   right shoulder surgery      OB History   No obstetric history on file.      Home Medications    Prior to Admission medications   Medication Sig Start Date End Date Taking? Authorizing Provider  ibuprofen (ADVIL) 200 MG tablet Take 200 mg by mouth every 6 (six) hours as needed.   Yes [provider]  calcium carbonate (OS-CAL - DOSED IN MG OF ELEMENTAL CALCIUM) 1250 (500 Ca) MG tablet Take 1 tablet by mouth.    [provider]  diclofenac  (VOLTAREN ) 75 MG EC tablet Take 1 tablet (75 mg total) by mouth 2 (two) times daily. Patient not taking: Reported on 05/26/2023 01/18/18   Magdalen Pasco RAMAN, DPM  Multiple Vitamin (MULTIVITAMIN WITH MINERALS) TABS tablet Take 1 tablet by mouth daily.    [provider]    Family History History reviewed. No pertinent family history.  Social History Social History    Tobacco Use   Smoking status: Never   Smokeless tobacco: Never  Vaping Use   Vaping status: Never Used  Substance Use Topics   Alcohol use: No   Drug use: No     Allergies   Codeine   Review of Systems Review of Systems  Musculoskeletal:        Right forearm and left hip pain after fall     Physical Exam Triage Vital Signs ED Triage Vitals  Encounter Vitals Group     BP 03/25/24 1126 (!) 167/80     Girls Systolic BP Percentile --      Girls Diastolic BP Percentile --      Boys Systolic BP Percentile --      Boys Diastolic BP Percentile --      Pulse Rate 03/25/24 1126 75     Resp 03/25/24 1126 16     Temp 03/25/24 1126 97.7 F (36.5 C)     Temp Source 03/25/24 1126 Oral     SpO2 03/25/24 1126 98 %     Weight --      Height --      Head Circumference --      Peak Flow --      Pain Score  03/25/24 1128 6     Pain Loc --      Pain Education --      Exclude from Growth Chart --    No data found.  Updated Vital Signs BP (!) 167/80 (BP Location: Left Arm)   Pulse 75   Temp 97.7 F (36.5 C) (Oral)   Resp 16   SpO2 98%   Visual Acuity Right Eye Distance:   Left Eye Distance:   Bilateral Distance:    Right Eye Near:   Left Eye Near:    Bilateral Near:     Physical Exam Vitals and nursing note reviewed.  Constitutional:      General: She is not in acute distress.    Appearance: Normal appearance. She is not ill-appearing.  HENT:     Head: Normocephalic and atraumatic.  Eyes:     Pupils: Pupils are equal, round, and reactive to light.  Cardiovascular:     Rate and Rhythm: Normal rate.  Pulmonary:     Effort: Pulmonary effort is normal.  Musculoskeletal:       Arms:       Legs:     Comments: Minimal swelling to the mid lateral right forearm with tenderness to palpation.  There is no ecchymosis, deformity.  No tenderness with palpation to right wrist, right elbow, or right hand.  Radial pulse +2.  Pain with rotation of the forearm.  There is  no swelling or ecchymosis of the left hip.  Tender to palpation to the mid upper buttock.  No tenderness to lumbar spine, or tailbone.  Full range of motion of hip with pain on external rotation.  Strength is 5 out of 5 bilateral lower extremities.  Skin:    General: Skin is warm and dry.  Neurological:     General: No focal deficit present.     Mental Status: She is alert and oriented to person, place, and time.  Psychiatric:        Mood and Affect: Mood normal.        Behavior: Behavior normal.      UC Treatments / Results  Labs (all labs ordered are listed, but only abnormal results are displayed) Labs Reviewed - No data to display  EKG   Radiology DG Hip Unilat With Pelvis 2-3 Views Left Result Date: 03/25/2024 EXAM: 2 Or More View(s) Xray Of The Unilateral Hip 03/25/2024 11:58:32 Am COMPARISON: None Available. CLINICAL HISTORY: Pain FINDINGS: BONES AND JOINTS: No Acute Fracture Or Focal Osseous Lesion. The Hip Joint Is Maintained. No Significant Degenerative Changes. SOFT TISSUES: The Soft Tissues Are Unremarkable. IMPRESSION: 1. Negative . Electronically signed by: Norleen Kil MD 03/25/2024 12:04 PM EST RP Workstation: [id number]   DG Forearm Right Result Date: 03/25/2024 EXAM: 2 VIEW(S) XRAY OF THE  FOREARM COMPARISON: None available. CLINICAL HISTORY: fell down three stairs today FINDINGS: The forearm is normal. BONES AND JOINTS: No acute fracture. No focal osseous lesion. No joint dislocation. SOFT TISSUES: The soft tissues are unremarkable. IMPRESSION: 1. No acute fracture or dislocation. Electronically signed by: Norleen Kil MD 03/25/2024 12:03 PM EST RP Workstation: [id number]    Procedures Procedures (including critical care time)  Medications Ordered in UC Medications - No data to display  Initial Impression / Assessment and Plan / UC Course  I have reviewed the triage vital signs and the nursing notes.  Pertinent labs & imaging results that were available during my  care of the patient were reviewed by me and considered in my  medical decision making (see chart for details).     Reviewed exam and symptoms with patient.  X-ray of hip and forearm are negative for fracture.  Discussed soft tissue injury/contusion.  Continue RICE therapy and OTC analgesics as needed.  PCP follow-up if symptoms do not improve.  ER precautions reviewed. Final Clinical Impressions(s) / UC Diagnoses   Final diagnoses:  Fall down stairs, initial encounter  Left hip pain  Right forearm pain  Soft tissue injury of left hip, initial encounter  Soft tissue injury of right forearm, initial encounter     Discharge Instructions      Your x-rays were negative for fracture.  You may continue ice, elevation, and over-the-counter Tylenol or ibuprofen as needed.  Please follow-up with your PCP if your symptoms do not improve.  Please go to the ER if you develop any worsening symptoms.  Hope you feel better soon!     ED Prescriptions   None    PDMP not reviewed this encounter.   Loreda Myla SAUNDERS, NP 03/25/24 1209

## 2024-03-25 NOTE — Discharge Instructions (Addendum)
 Your x-rays were negative for fracture.  You may continue ice, elevation, and over-the-counter Tylenol or ibuprofen as needed.  Please follow-up with your PCP if your symptoms do not improve.  Please go to the ER if you develop any worsening symptoms.  Hope you feel better soon!
